# Patient Record
Sex: Female | Born: 1940 | Hispanic: Yes | Marital: Single
Health system: Southern US, Community
[De-identification: ages and names within clinical notes are randomized; demographics above are authoritative.]

## PROBLEM LIST (undated history)

## (undated) DIAGNOSIS — R350 Frequency of micturition: Secondary | ICD-10-CM

## (undated) DIAGNOSIS — G4700 Insomnia, unspecified: Secondary | ICD-10-CM

## (undated) DIAGNOSIS — K219 Gastro-esophageal reflux disease without esophagitis: Secondary | ICD-10-CM

## (undated) DIAGNOSIS — I209 Angina pectoris, unspecified: Secondary | ICD-10-CM

## (undated) DIAGNOSIS — E119 Type 2 diabetes mellitus without complications: Secondary | ICD-10-CM

## (undated) DIAGNOSIS — M25561 Pain in right knee: Secondary | ICD-10-CM

## (undated) HISTORY — PX: OTHER SURGICAL HISTORY: SHX169

## (undated) HISTORY — DX: Pain in right knee: M25.561

## (undated) HISTORY — DX: Type 2 diabetes mellitus without complications: E11.9

## (undated) HISTORY — DX: Gastro-esophageal reflux disease without esophagitis: K21.9

---

## 2010-04-22 ENCOUNTER — Ambulatory Visit: Payer: Self-pay | Admitting: Family Medicine

## 2010-08-07 NOTE — Assessment & Plan Note (Signed)
Summary: FLU SHOT/EVM  Nurse Visit   Immunizations Administered:  Influenza Vaccine # 1:    Vaccine Type: Fluvax Non-MCR    Site: right deltoid    Mfr: GlaxoSmithKline    Dose: 0.5 ml    Route: IM    Given by: Jaime Boothe LPN    Exp. Date: 5/12    Lot #: [id number]    VIS given: 01/30/10 version given April 22, 2010.  Orders Added: 1)  Influenza Vaccine NON MCR [00028] 

## 2012-06-17 ENCOUNTER — Ambulatory Visit: Payer: Self-pay | Admitting: Internal Medicine

## 2012-06-29 ENCOUNTER — Ambulatory Visit: Payer: Self-pay

## 2012-08-05 ENCOUNTER — Ambulatory Visit: Payer: Self-pay | Admitting: General Surgery

## 2013-02-17 ENCOUNTER — Ambulatory Visit: Payer: Self-pay | Admitting: Internal Medicine

## 2013-04-22 ENCOUNTER — Ambulatory Visit: Payer: Self-pay | Admitting: Internal Medicine

## 2013-05-13 ENCOUNTER — Ambulatory Visit: Payer: Self-pay | Admitting: Internal Medicine

## 2013-05-19 ENCOUNTER — Inpatient Hospital Stay: Payer: Self-pay | Admitting: Internal Medicine

## 2013-05-19 LAB — URINALYSIS, COMPLETE
Bilirubin,UR: NEGATIVE
Blood: NEGATIVE
Glucose,UR: NEGATIVE mg/dL (ref 0–75)
Ketone: NEGATIVE
Leukocyte Esterase: NEGATIVE
Ph: 7 (ref 4.5–8.0)
Protein: NEGATIVE
RBC,UR: 1 /HPF (ref 0–5)
WBC UR: <1 /HPF (ref 0–5)

## 2013-05-19 LAB — CBC
HGB: 10.8 g/dL — ABNORMAL LOW (ref 12.0–16.0)
MCH: 22.5 pg — ABNORMAL LOW (ref 26.0–34.0)
MCHC: 31.6 g/dL — ABNORMAL LOW (ref 32.0–36.0)

## 2013-05-19 LAB — COMPREHENSIVE METABOLIC PANEL
Albumin: 3.8 g/dL (ref 3.4–5.0)
Anion Gap: 1 — ABNORMAL LOW (ref 7–16)
BUN: 20 mg/dL — ABNORMAL HIGH (ref 7–18)
Calcium, Total: 9.3 mg/dL (ref 8.5–10.1)
Creatinine: 0.87 mg/dL (ref 0.60–1.30)
Glucose: 193 mg/dL — ABNORMAL HIGH (ref 65–99)
Osmolality: 284 (ref 275–301)
SGOT(AST): 27 U/L (ref 15–37)
Total Protein: 7.7 g/dL (ref 6.4–8.2)

## 2013-05-19 LAB — TROPONIN I: Troponin-I: 0.02 ng/mL

## 2013-05-19 LAB — IRON AND TIBC
Iron Bind.Cap.(Total): 332 ug/dL (ref 250–450)
Iron: 64 ug/dL (ref 50–170)
Unbound Iron-Bind.Cap.: 268 ug/dL

## 2013-05-19 LAB — TSH: Thyroid Stimulating Horm: 2.88 u[IU]/mL

## 2013-05-20 LAB — LIPID PANEL
HDL Cholesterol: 45 mg/dL (ref 40–60)
Ldl Cholesterol, Calc: 46 mg/dL (ref 0–100)
Triglycerides: 140 mg/dL (ref 0–200)
VLDL Cholesterol, Calc: 28 mg/dL (ref 5–40)

## 2013-05-20 LAB — CBC WITH DIFFERENTIAL/PLATELET
Eosinophil %: 6 %
Lymphocyte #: 3.2 10*3/uL (ref 1.0–3.6)
Monocyte %: 8.6 %
Neutrophil %: 39.5 %
Platelet: 181 10*3/uL (ref 150–440)
RBC: 4.61 10*6/uL (ref 3.80–5.20)
RDW: 14.8 % — ABNORMAL HIGH (ref 11.5–14.5)
WBC: 7 10*3/uL (ref 3.6–11.0)

## 2013-05-20 LAB — COMPREHENSIVE METABOLIC PANEL
Alkaline Phosphatase: 80 U/L (ref 50–136)
Bilirubin,Total: 0.5 mg/dL (ref 0.2–1.0)
Co2: 26 mmol/L (ref 21–32)
Creatinine: 0.7 mg/dL (ref 0.60–1.30)
EGFR (Non-African Amer.): >60
Glucose: 160 mg/dL — ABNORMAL HIGH (ref 65–99)
Osmolality: 284 (ref 275–301)
Total Protein: 7.1 g/dL (ref 6.4–8.2)

## 2013-05-23 ENCOUNTER — Ambulatory Visit: Payer: Self-pay | Admitting: Neurology

## 2013-05-24 ENCOUNTER — Encounter (HOSPITAL_COMMUNITY): Payer: Self-pay | Admitting: Physical Medicine and Rehabilitation

## 2013-05-24 ENCOUNTER — Encounter: Payer: Self-pay | Admitting: *Deleted

## 2013-05-24 ENCOUNTER — Inpatient Hospital Stay (HOSPITAL_COMMUNITY)
Admission: RE | Admit: 2013-05-24 | Discharge: 2013-06-17 | DRG: 945 | Disposition: A | Discharge status: Home Health | Payer: Medicaid Other | Source: Other Acute Inpatient Hospital | Attending: Physical Medicine & Rehabilitation | Admitting: Physical Medicine & Rehabilitation

## 2013-05-24 ENCOUNTER — Other Ambulatory Visit: Payer: Self-pay | Admitting: Physical Medicine and Rehabilitation

## 2013-05-24 ENCOUNTER — Encounter: Payer: Self-pay | Admitting: Physical Medicine and Rehabilitation

## 2013-05-24 DIAGNOSIS — I639 Cerebral infarction, unspecified: Secondary | ICD-10-CM | POA: Diagnosis present

## 2013-05-24 DIAGNOSIS — B964 Proteus (mirabilis) (morganii) as the cause of diseases classified elsewhere: Secondary | ICD-10-CM

## 2013-05-24 DIAGNOSIS — E1165 Type 2 diabetes mellitus with hyperglycemia: Secondary | ICD-10-CM

## 2013-05-24 DIAGNOSIS — E1142 Type 2 diabetes mellitus with diabetic polyneuropathy: Secondary | ICD-10-CM

## 2013-05-24 DIAGNOSIS — I10 Essential (primary) hypertension: Secondary | ICD-10-CM

## 2013-05-24 DIAGNOSIS — I209 Angina pectoris, unspecified: Secondary | ICD-10-CM

## 2013-05-24 DIAGNOSIS — E785 Hyperlipidemia, unspecified: Secondary | ICD-10-CM | POA: Diagnosis present

## 2013-05-24 DIAGNOSIS — I6789 Other cerebrovascular disease: Secondary | ICD-10-CM

## 2013-05-24 DIAGNOSIS — Z5189 Encounter for other specified aftercare: Principal | ICD-10-CM

## 2013-05-24 DIAGNOSIS — E1149 Type 2 diabetes mellitus with other diabetic neurological complication: Secondary | ICD-10-CM

## 2013-05-24 DIAGNOSIS — R339 Retention of urine, unspecified: Secondary | ICD-10-CM

## 2013-05-24 DIAGNOSIS — M25519 Pain in unspecified shoulder: Secondary | ICD-10-CM | POA: Diagnosis not present

## 2013-05-24 DIAGNOSIS — R4701 Aphasia: Secondary | ICD-10-CM

## 2013-05-24 DIAGNOSIS — D509 Iron deficiency anemia, unspecified: Secondary | ICD-10-CM | POA: Diagnosis present

## 2013-05-24 DIAGNOSIS — I6529 Occlusion and stenosis of unspecified carotid artery: Secondary | ICD-10-CM

## 2013-05-24 DIAGNOSIS — N179 Acute kidney failure, unspecified: Secondary | ICD-10-CM

## 2013-05-24 DIAGNOSIS — G8190 Hemiplegia, unspecified affecting unspecified side: Secondary | ICD-10-CM

## 2013-05-24 DIAGNOSIS — N390 Urinary tract infection, site not specified: Secondary | ICD-10-CM | POA: Diagnosis not present

## 2013-05-24 DIAGNOSIS — I6330 Cerebral infarction due to thrombosis of unspecified cerebral artery: Secondary | ICD-10-CM

## 2013-05-24 DIAGNOSIS — N319 Neuromuscular dysfunction of bladder, unspecified: Secondary | ICD-10-CM

## 2013-05-24 DIAGNOSIS — A498 Other bacterial infections of unspecified site: Secondary | ICD-10-CM

## 2013-05-24 DIAGNOSIS — N139 Obstructive and reflux uropathy, unspecified: Secondary | ICD-10-CM

## 2013-05-24 DIAGNOSIS — K219 Gastro-esophageal reflux disease without esophagitis: Secondary | ICD-10-CM

## 2013-05-24 HISTORY — DX: Insomnia, unspecified: G47.00

## 2013-05-24 HISTORY — DX: Angina pectoris, unspecified: I20.9

## 2013-05-24 HISTORY — DX: Frequency of micturition: R35.0

## 2013-05-24 LAB — GLUCOSE, CAPILLARY: Glucose-Capillary: 287 mg/dL — ABNORMAL HIGH (ref 70–99)

## 2013-05-24 MED ORDER — INSULIN ASPART 100 UNIT/ML ~~LOC~~ SOLN
0.0000 [IU] | Freq: Every day | SUBCUTANEOUS | Status: DC
  Administered 2013-05-24: 3 [IU] via SUBCUTANEOUS
  Administered 2013-05-25: 2 [IU] via SUBCUTANEOUS
  Administered 2013-05-26: 4 [IU] via SUBCUTANEOUS
  Administered 2013-05-27: 2 [IU] via SUBCUTANEOUS
  Administered 2013-05-29 – 2013-06-01 (×4): 3 [IU] via SUBCUTANEOUS
  Administered 2013-06-02 – 2013-06-05 (×3): 2 [IU] via SUBCUTANEOUS

## 2013-05-24 MED ORDER — ATORVASTATIN CALCIUM 20 MG PO TABS
20.0000 mg | ORAL_TABLET | Freq: Every day | ORAL | Status: DC
  Administered 2013-05-24 – 2013-06-17 (×25): 20 mg via ORAL
  Filled 2013-05-24 (×25): qty 1

## 2013-05-24 MED ORDER — INSULIN ASPART 100 UNIT/ML ~~LOC~~ SOLN
0.0000 [IU] | Freq: Three times a day (TID) | SUBCUTANEOUS | Status: DC
  Administered 2013-05-24 – 2013-05-25 (×3): 2 [IU] via SUBCUTANEOUS
  Administered 2013-05-25 – 2013-05-26 (×2): 5 [IU] via SUBCUTANEOUS
  Administered 2013-05-26: 2 [IU] via SUBCUTANEOUS
  Administered 2013-05-26: 5 [IU] via SUBCUTANEOUS
  Administered 2013-05-27: 2 [IU] via SUBCUTANEOUS
  Administered 2013-05-27: 3 [IU] via SUBCUTANEOUS
  Administered 2013-05-27: 5 [IU] via SUBCUTANEOUS
  Administered 2013-05-28: 3 [IU] via SUBCUTANEOUS
  Administered 2013-05-28 (×2): 5 [IU] via SUBCUTANEOUS
  Administered 2013-05-29: 3 [IU] via SUBCUTANEOUS

## 2013-05-24 MED ORDER — TRAZODONE HCL 50 MG PO TABS
25.0000 mg | ORAL_TABLET | Freq: Every evening | ORAL | Status: DC | PRN
  Administered 2013-05-30: 50 mg via ORAL
  Filled 2013-05-24: qty 1

## 2013-05-24 MED ORDER — ALUM & MAG HYDROXIDE-SIMETH 200-200-20 MG/5ML PO SUSP
30.0000 mL | ORAL | Status: DC | PRN
  Administered 2013-06-03: 30 mL via ORAL
  Filled 2013-05-24: qty 30

## 2013-05-24 MED ORDER — METFORMIN HCL 500 MG PO TABS
500.0000 mg | ORAL_TABLET | Freq: Two times a day (BID) | ORAL | Status: DC
  Administered 2013-05-24 – 2013-05-25 (×2): 500 mg via ORAL
  Filled 2013-05-24 (×4): qty 1

## 2013-05-24 MED ORDER — DIPHENHYDRAMINE HCL 12.5 MG/5ML PO ELIX: 12.5000 mg | ORAL_SOLUTION | Freq: Four times a day (QID) | ORAL | Status: DC | PRN

## 2013-05-24 MED ORDER — FLEET ENEMA 7-19 GM/118ML RE ENEM: 1.0000 | ENEMA | Freq: Once | RECTAL | Status: AC | PRN

## 2013-05-24 MED ORDER — PANTOPRAZOLE SODIUM 20 MG PO TBEC
20.0000 mg | DELAYED_RELEASE_TABLET | Freq: Every day | ORAL | Status: DC
  Administered 2013-05-25 – 2013-06-17 (×24): 20 mg via ORAL
  Filled 2013-05-24 (×26): qty 1

## 2013-05-24 MED ORDER — PROCHLORPERAZINE MALEATE 5 MG PO TABS
5.0000 mg | ORAL_TABLET | Freq: Four times a day (QID) | ORAL | Status: DC | PRN
  Filled 2013-05-24: qty 2

## 2013-05-24 MED ORDER — TRAMADOL HCL 50 MG PO TABS
50.0000 mg | ORAL_TABLET | Freq: Four times a day (QID) | ORAL | Status: DC | PRN
  Administered 2013-05-24 – 2013-06-17 (×17): 50 mg via ORAL
  Filled 2013-05-24 (×17): qty 1

## 2013-05-24 MED ORDER — ACETAMINOPHEN 325 MG PO TABS
325.0000 mg | ORAL_TABLET | ORAL | Status: DC | PRN
  Administered 2013-05-24 – 2013-06-14 (×10): 650 mg via ORAL
  Filled 2013-05-24 (×11): qty 2

## 2013-05-24 MED ORDER — POLYSACCHARIDE IRON COMPLEX 150 MG PO CAPS
150.0000 mg | ORAL_CAPSULE | Freq: Every day | ORAL | Status: DC
  Administered 2013-05-25 – 2013-06-17 (×24): 150 mg via ORAL
  Filled 2013-05-24 (×25): qty 1

## 2013-05-24 MED ORDER — ENOXAPARIN SODIUM 40 MG/0.4ML ~~LOC~~ SOLN
40.0000 mg | SUBCUTANEOUS | Status: DC
  Administered 2013-05-24 – 2013-06-16 (×24): 40 mg via SUBCUTANEOUS
  Filled 2013-05-24 (×25): qty 0.4

## 2013-05-24 MED ORDER — BISACODYL 10 MG RE SUPP: 10.0000 mg | Freq: Every day | RECTAL | Status: DC | PRN

## 2013-05-24 MED ORDER — GUAIFENESIN-DM 100-10 MG/5ML PO SYRP: 5.0000 mL | ORAL_SOLUTION | Freq: Four times a day (QID) | ORAL | Status: DC | PRN

## 2013-05-24 MED ORDER — RANOLAZINE ER 500 MG PO TB12
500.0000 mg | ORAL_TABLET | Freq: Two times a day (BID) | ORAL | Status: DC
  Administered 2013-05-24 – 2013-06-17 (×48): 500 mg via ORAL
  Filled 2013-05-24 (×51): qty 1

## 2013-05-24 MED ORDER — PROCHLORPERAZINE 25 MG RE SUPP
12.5000 mg | Freq: Four times a day (QID) | RECTAL | Status: DC | PRN
  Filled 2013-05-24: qty 1

## 2013-05-24 MED ORDER — ASPIRIN EC 325 MG PO TBEC
325.0000 mg | DELAYED_RELEASE_TABLET | Freq: Every day | ORAL | Status: DC
  Administered 2013-05-25 – 2013-06-17 (×24): 325 mg via ORAL
  Filled 2013-05-24 (×26): qty 1

## 2013-05-24 MED ORDER — SENNOSIDES-DOCUSATE SODIUM 8.6-50 MG PO TABS: 1.0000 | ORAL_TABLET | Freq: Every evening | ORAL | Status: DC | PRN

## 2013-05-24 MED ORDER — PROCHLORPERAZINE EDISYLATE 5 MG/ML IJ SOLN
5.0000 mg | Freq: Four times a day (QID) | INTRAMUSCULAR | Status: DC | PRN
  Filled 2013-05-24: qty 2

## 2013-05-24 NOTE — PMR Pre-admission (Signed)
[employer] PMR Admission Coordinator Pre-Admission Assessment  Patient: Stacie [name] is an 72 y.o., female MRN: [id number] DOB: [birth year] Height: 5\' 3"  (160 cm) Weight: 83.462 kg (184 lb)  Insurance Information Self pay - Has a green card and social security number.  Can apply for medicaid in the next month since she has been in [place] for 5 years per grand daughter.  Medicaid Application Date: Pending       Case Manager:   Disability Application Date:  Pendng      Case Worker:    Emergency Contact Information Contact Information   Name Relation Home Work Mobile   Towson Grandaughter   [phone]   Louanna Raw   [phone]      Current Medical History  Patient Admitting Diagnosis:  L CVA  History of Present Illness: A 72 yr old Spanish speaking female presented to [facility] with right sided weakness.  Was found on the floor by family and unable to get up.  She was noted to have difficulty speaking and was not able to speak the way she usually did.  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  Patient has expressive aphasia. Patient has had lethergy and confusion. Patient is following commands.   Family reports patient more alert now.   PT and OT have been seeing patient and recommended inpatient rehab.  Family very supportive and wanted inpatient rehab admission.  Patient's medical record from [facility] has been reviewed by the rehabilitation admission coordinator and physician.  NIH Stroke scale: Glascow Coma Scale:  Past Medical History  Diabetes mellitus Hyperlipidemia CAD Right lower extremity/knee pain  Family History  Sister with hypertension.  Two sisters have diabetes.  Parents are both deceased.  Prior Rehab/Hospitalizations:  No previous rehab   Current Medications Asa, lipitor, colace, heparin injection, novolog insulin, prilosec, ranexa, glucophage  Patients Current Diet:  Mechanical soft  diet  Precautions / Restrictions Precautions Precautions: Fall   Prior Activity Level Limited Community (1-2x/wk): Went out 3 X a week to [employer] and Sunday, grocery store and to the [employer] / Equipment None used prior to admission   Prior Functional Level Current Functional Level  Bed Mobility  Independent  Max assist   Transfers  Independent  Max assist   Mobility - Programmer, applications  Total assist   Upper Body Dressing  Independent  Mod assist   Lower Body Dressing  Independent  Max assist   Grooming  Independent  Mod assist   Eating/Drinking  Independent  Mod assist   Toilet Transfer  Independent  Total assist   Bladder Continence   WDL  Incontinent   Bowel Management  WDL  Having BM regularly   Stair Climbing  Independent Other (Not tried)   Communication  Spanish speaking  Expressive aphasia   Memory  WDL  Impaired   Cooking/Meal Prep  I      Housework  I    Money Management  I    Driving        Previous Home Environment Living Arrangements: Other relatives (Lives with granddaughter.)  Lives With: Other (Comment) (Lives with granddaughter) Available Help at Discharge: Family;Available 24 hours/day Type of Home: House Home Layout: One level Home Access: Stairs to enter [employer] of Steps: 4  Discharge Living Setting Plans for Discharge Living Setting: House;Lives with (comment) (Lives with granddaughter) Type of Home at Discharge: House Discharge Home Layout: One level Discharge Home Access: Stairs  to enter Entrance Stairs-Number of Steps: 4 Does the patient have any problems obtaining your medications?: Yes (Describe) (No insurance.)  Social/Family/Support Systems Patient Roles: Parent Contact Information: Alfonso Patten - grand daughter [phone] Anticipated Caregiver: Granddaughters X 2 and other family members Ability/Limitations of Caregiver: Granddaughter  works, but family will share caregiver responsibilities Caregiver Availability: 24/7 Discharge Plan Discussed with Primary Caregiver: Yes Is Caregiver In Agreement with Plan?: Yes Does Caregiver/Family have Issues with Lodging/Transportation while Pt is in Rehab?: Yes (Sister from out of town may come stay in room with her.)  Goals/Additional Needs Patient/Family Goal for Rehab: PT/OT S/Min assist goals, ST S/Min Assist goals Expected length of stay: 2-3 weeks Cultural Considerations: Spanish, [place] Witness Dietary Needs: Mechanical soft diet Equipment Needs: TBD Additional Information: Has been in [place] for 5 years.  Did not drive.  Was independent.  Helped grandaughter with all chores at home. Pt/Family Agrees to Admission and willing to participate: Yes Program Orientation Provided & Reviewed with Pt/Caregiver Including Roles  & Responsibilities: Yes  Patient Condition: I met with the patient and family on 05/21/13 at the bedside.  Patient is a candidate for acute inpatient rehab admission due to new L CVA.  Currently needing PT/OT/ST therapies.  Can tolerate and benefit from comprehensive inpatient rehab program prior to home with family providing care and supervision.  Currently requiring mod/max assist for transfers.  Patient is medically stable and ready for inpatient rehab admission today, 05/24/13.  I have discussed my assessment with Dr. Riley Kill and have approval for acute inpatient rehab admission for today, 05/24/13  Preadmission Screen Completed By:  Ranelle Oyster, 05/24/2013 11:13 AM ______________________________________________________________________   Discussed status with Dr. Riley Kill on 05/24/13 at 1058 and received telephone approval for admission today.  Admission Coordinator:  Ranelle Oyster, time 1104/Date 05/24/13   Assessment/Plan: Diagnosis: left ACA infarct 1. Does the need for close, 24 hr/day  Medical supervision in concert with the patient's rehab needs make  it unreasonable for this patient to be served in a less intensive setting? Yes 2. Co-Morbidities requiring supervision/potential complications: dm, CAD 3. Due to bladder management, bowel management, safety, skin/wound care, disease management, medication administration, pain management and patient education, does the patient require 24 hr/day rehab nursing? Yes 4. Does the patient require coordinated care of a physician, rehab nurse, PT (1-2 hrs/day, 5 days/week), OT (1-2 hrs/day, 5 days/week) and SLP (1-2 hrs/day, 5 days/week) to address physical and functional deficits in the context of the above medical diagnosis(es)? Yes Addressing deficits in the following areas: balance, endurance, locomotion, strength, transferring, bowel/bladder control, bathing, dressing, feeding, grooming, toileting, cognition, speech, swallowing and psychosocial support 5. Can the patient actively participate in an intensive therapy program of at least 3 hrs of therapy 5 days a week? Yes 6. The potential for patient to make measurable gains while on inpatient rehab is excellent 7. Anticipated functional outcomes upon discharge from inpatients are supervision to min assist PT, supervision to min assist OT, supervision to min assist SLP 8. Estimated rehab length of stay to reach the above functional goals is: 14-20 days 9. Does the patient have adequate social supports to accommodate these discharge functional goals? Yes 10. Anticipated D/C setting: Home 11. Anticipated post D/C treatments: HH therapy 12. Overall Rehab/Functional Prognosis: good    RECOMMENDATIONS: This patient's condition is appropriate for continued rehabilitative care in the following setting: CIR Patient has agreed to participate in recommended program. Yes Note that insurance prior authorization may be required for reimbursement for recommended  care.  Comment: Pt to be admitted to inpatient rehab today.   Faith Rogue T 05/24/2013

## 2013-05-24 NOTE — Progress Notes (Signed)
Nutrition Brief Note  RD consulted by PA for "food choices." RD discussed nutrition hx with granddaughter and granddaughter-in-law at bedside. Pt is currently sleeping. Per family, pt ate well PTA with stable weight. While at [facility], pt was eating almost 100% of all meals. Family would like to bring in some of patient's favorite foods for her. We discussed current diet restriction of Dysphagia 3 and discussed need for compliance with this diet until SLP says otherwise. Family verbalized understanding. Encouraged family to bring in foods that meet patient's diet restrictions. Family denies any further questions/concerns at this time.  Body mass index is 33.59 kg/(m^2). Patient meets criteria for Obese Class I based on current BMI.   Current diet order is Dysphagia 3. Labs and medications reviewed.   No nutrition interventions warranted at this time. If nutrition issues arise, please consult RD.   Jarold Motto MS, RD, LDN Pager: [phone] After-hours pager: [phone]

## 2013-05-24 NOTE — Progress Notes (Signed)
Pt transferred to Rehab from [facility]. [place], but arousal during most of orientation. Family in room to assist with questions. Family orientated to Rehab schedule and expectations. Diagnostic specific handout provided. Spanish is pt's primary language.

## 2013-05-24 NOTE — H&P (Signed)
Physical Medicine and Rehabilitation Admission H&P     CC:  Difficulty speaking, right sided weakness.   HPI:  Ms. Stacie [name] is a 72 year old non-english speaking [place] female with history of DM, HTN, angina (negative cath this summer); who was found on the floor by family with inability to speak or move her right side. She was taken to [facility] on 05/19/13 and MRI brain done revealing L-ACA infarct affecting medial frontal lobe and mild generalized small vessel disease. Carotid dopplers with <50% R-ICA stenosis.  2D echo with EF 55-60% with moderately elevated pulmonary artery pressures. Mild TVR. Neurology (Dr. 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   Patient continues to have dense right hemiparesis, is able to follow basic commands  25-50% accuracy (with interpreter) but mute. On mechanical soft diet. Therapies ongoing and working on pregait activities.  Therapy team recommended CIR and patient admitted today for intensive therapies.   Review of Systems  Eyes: Negative for blurred vision and double vision.  Respiratory: Positive for shortness of breath. Negative for cough and wheezing.   Cardiovascular: Positive for chest pain (better with ranexa).  Gastrointestinal: Positive for heartburn. Negative for nausea and vomiting.  Genitourinary: Positive for urgency and frequency.  Musculoskeletal: Positive for back pain and myalgias.  Neurological: Positive for sensory change (burning in RLE since stroke), speech change, focal weakness and headaches (new since stroke).  Psychiatric/Behavioral: The patient has insomnia (chronic problem).       Past Medical History   Diagnosis  Date   .  Diabetes mellitus without complication     .  GERD (gastroesophageal reflux disease)     .  Right knee pain      Past Surgical History   Procedure  Laterality  Date   .  Tendon  Right         heel tendon surgery    Family History   Problem  Relation  Age  of Onset   .  Diabetes  Sister     .  Hypertension  Sister      Social History:  Widowed. Lives with granddaughter and independent PTA. Goes back and forth to [place] during the year.  Granddaughter works but large family around who can help past discharge.  Does not use  tobacco, alcohol, or illicit drugs.  Allergies: No Known Allergies  (Not in a hospital admission)  Home:     Functional History: Independent PTA.    Functional Status:   Mobility: Max assist for bed mobility. Moderate to max assist for sit to stand transfer.   Able to sit at EOB with supervision and self correct for LOB. Able to weight shift with CGA but difficulty weight bearing .     ADL: Needs assistance for ADLs  Cognition: Unable to speak--expressive deficits. Able to follow basic commands  25-50% with visual cues.     Filed Vitals:   05/24/13 1345  BP: 121/73  Pulse: 77  Temp: 98.7 F (37.1 C)  Resp: 24   Physical Exam  Nursing note and vitals reviewed. Constitutional: She is oriented to person, place, and time. She appears well-developed and well-nourished.  HENT:  Head: Normocephalic.  Right Ear: External ear normal.  Left Ear: External ear normal.  Mouth/Throat: Oropharynx is clear and moist. No oropharyngeal exudate.  Eyes: Conjunctivae and EOM are normal. Pupils are equal, round, and reactive to light. Right eye exhibits no discharge. Left eye exhibits no discharge.  No scleral icterus.  Neck: No JVD present. No tracheal deviation present. No thyromegaly present.  Cardiovascular: Normal rate and regular rhythm.  Exam reveals no friction rub.   No murmur heard. Pulmonary/Chest: Effort normal and breath sounds normal. No respiratory distress. She has no wheezes. She has no rales.  Abdominal: There is no tenderness. There is no rebound.  Musculoskeletal: She exhibits no edema and no tenderness.  Lymphadenopathy:    She has no cervical adenopathy.  Neurological: She is alert and  oriented to person, place, and time.  Very alert. Speaks no english. Has mild right facial weakness. Followed all commands and answered questions in spanish without substantial difficulties. Appears to have primarily expressive language deficits. RUE is 0/5 prox to distal. RLE is also 0/5 prox to distal. She senses pinch in both the right arm and leg. No resting tone and dtr's are 1+    Recent Labs:  Na: 140     K: 3.7    Cl:106   Co2: 26   BUN: 17   Cr: 0.7      Chol: 119      LDL: 46    HDL: 45   Trig: 140          Iron: 19   TIBC: 332    UIBC: 268 Hgb: 10.3    Hct: 32.6    WBC:  7.0    Plt: 181 Hgb A1C:  7.4              TSH: 2.88   Post Admission Physician Evaluation: 1. Functional deficits secondary  to thrombotic left ACA infarct. 2. Patient is admitted to receive collaborative, interdisciplinary care between the physiatrist, rehab nursing staff, and therapy team. 3. Patient's level of medical complexity and substantial therapy needs in context of that medical necessity cannot be provided at a lesser intensity of care such as a SNF. 4. Patient has experienced substantial functional loss from his/her baseline which was documented above under the "Functional History" and "Functional Status" headings.  Judging by the patient's diagnosis, physical exam, and functional history, the patient has potential for functional progress which will result in measurable gains while on inpatient rehab.  These gains will be of substantial and practical use upon discharge  in facilitating mobility and self-care at the household level. 5. Physiatrist will provide 24 hour management of medical needs as well as oversight of the therapy plan/treatment and provide guidance as appropriate regarding the interaction of the two. 6. 24 hour rehab nursing will assist with bladder management, bowel management, safety, skin/wound care, disease management, medication administration, pain management and patient education  and  help integrate therapy concepts, techniques,education, etc. 7. PT will assess and treat for/with: Lower extremity strength, range of motion, stamina, balance, functional mobility, safety, adaptive techniques and equipment, NMR, cognitive linguistic integration.   Goals are: supervision to min assist. 8. OT will assess and treat for/with: ADL's, functional mobility, safety, upper extremity strength, adaptive techniques and equipment, NMR, cognitive linguistic integration.   Goals are: supervision to min assist. 9. SLP will assess and treat for/with: language, communication.  Goals are: supervision to min assist. 10. Case Management and Social Worker will assess and treat for psychological issues and discharge planning. 11. Team conference will be held weekly to assess progress toward goals and to determine barriers to discharge. 12. Patient will receive at least 3 hours of therapy per day at least 5 days per week. 13. ELOS: 20-25 days  14. 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 DVT Prophylaxis/Anticoagulation: Pharmaceutical: Lovenox 2. Pain Management:  Will continue prn tramadol for chronic RLE/right knee pain.   3. Mood: difficulty to ascertain with language as well as language barrier. Family to assist with input. LCSW to follow for evaluation. Appeared to be fairly up beat on examination today. .   4. Neuropsych: This patient is not capable of making decisions on her own behalf. 5. HTN:  Will monitor with bid checks. Continue renaxa. Want to avoid hypotension for now.   6. DM type 2: Continue metformin bid. Monitor BS with ac/hs checks and use SSI for elevated BS. Titrate as indicated for BS control.   7. H/o angina:  Will continue Renexa.  8. Iron deficiency anemia: will add iron supplement. Check Vit B12 levels.    Ranelle Oyster, MD, [facility] Physical Medicine & Rehabilitation

## 2013-05-25 ENCOUNTER — Inpatient Hospital Stay (HOSPITAL_COMMUNITY): Payer: Self-pay | Admitting: Occupational Therapy

## 2013-05-25 ENCOUNTER — Inpatient Hospital Stay (HOSPITAL_COMMUNITY): Payer: Self-pay | Admitting: Rehabilitation

## 2013-05-25 ENCOUNTER — Inpatient Hospital Stay (HOSPITAL_COMMUNITY): Payer: Medicaid Other | Admitting: Speech Pathology

## 2013-05-25 DIAGNOSIS — I6330 Cerebral infarction due to thrombosis of unspecified cerebral artery: Secondary | ICD-10-CM

## 2013-05-25 DIAGNOSIS — E1165 Type 2 diabetes mellitus with hyperglycemia: Secondary | ICD-10-CM

## 2013-05-25 LAB — COMPREHENSIVE METABOLIC PANEL
ALT: 10 U/L (ref 0–35)
AST: 15 U/L (ref 0–37)
Albumin: 3 g/dL — ABNORMAL LOW (ref 3.5–5.2)
Alkaline Phosphatase: 64 U/L (ref 39–117)
Chloride: 100 mEq/L (ref 96–112)
Creatinine, Ser: 1.54 mg/dL — ABNORMAL HIGH (ref 0.50–1.10)
Potassium: 4.3 mEq/L (ref 3.5–5.1)
Sodium: 138 mEq/L (ref 135–145)
Total Bilirubin: 0.4 mg/dL (ref 0.3–1.2)

## 2013-05-25 LAB — CBC WITH DIFFERENTIAL/PLATELET
Basophils Absolute: 0 10*3/uL (ref 0.0–0.1)
Basophils Relative: 0 % (ref 0–1)
Eosinophils Absolute: 0.5 10*3/uL (ref 0.0–0.7)
Eosinophils Relative: 4 % (ref 0–5)
HCT: 31.9 % — ABNORMAL LOW (ref 36.0–46.0)
Hemoglobin: 10.4 g/dL — ABNORMAL LOW (ref 12.0–15.0)
Lymphocytes Relative: 27 % (ref 12–46)
Lymphs Abs: 3.2 10*3/uL (ref 0.7–4.0)
MCH: 22.8 pg — ABNORMAL LOW (ref 26.0–34.0)
MCHC: 32.6 g/dL (ref 30.0–36.0)
MCV: 70 fL — ABNORMAL LOW (ref 78.0–100.0)
Monocytes Absolute: 1.5 10*3/uL — ABNORMAL HIGH (ref 0.1–1.0)
Monocytes Relative: 13 % — ABNORMAL HIGH (ref 3–12)
Neutro Abs: 6.6 10*3/uL (ref 1.7–7.7)
Neutrophils Relative %: 56 % (ref 43–77)
Platelets: 203 10*3/uL (ref 150–400)
RBC: 4.56 MIL/uL (ref 3.87–5.11)
RDW: 14.7 % (ref 11.5–15.5)
WBC: 11.8 10*3/uL — ABNORMAL HIGH (ref 4.0–10.5)

## 2013-05-25 LAB — URINALYSIS, ROUTINE W REFLEX MICROSCOPIC
Bilirubin Urine: NEGATIVE
Glucose, UA: NEGATIVE mg/dL
Hgb urine dipstick: NEGATIVE
Ketones, ur: NEGATIVE mg/dL
Leukocytes, UA: NEGATIVE
Nitrite: NEGATIVE
Protein, ur: NEGATIVE mg/dL
Specific Gravity, Urine: 1.012 (ref 1.005–1.030)
Urobilinogen, UA: 0.2 mg/dL (ref 0.0–1.0)
pH: 5 (ref 5.0–8.0)

## 2013-05-25 LAB — GLUCOSE, CAPILLARY
Glucose-Capillary: 189 mg/dL — ABNORMAL HIGH (ref 70–99)
Glucose-Capillary: 297 mg/dL — ABNORMAL HIGH (ref 70–99)
Glucose-Capillary: 183 mg/dL — ABNORMAL HIGH (ref 70–99)
Glucose-Capillary: 201 mg/dL — ABNORMAL HIGH (ref 70–99)

## 2013-05-25 MED ORDER — CEPHALEXIN 250 MG PO CAPS
250.0000 mg | ORAL_CAPSULE | Freq: Three times a day (TID) | ORAL | Status: DC
  Administered 2013-05-25 – 2013-05-26 (×3): 250 mg via ORAL
  Filled 2013-05-25 (×6): qty 1

## 2013-05-25 MED ORDER — GLIMEPIRIDE 2 MG PO TABS
2.0000 mg | ORAL_TABLET | Freq: Every day | ORAL | Status: DC
  Filled 2013-05-25 (×2): qty 1

## 2013-05-25 MED ORDER — SODIUM CHLORIDE 0.9 % IV SOLN
INTRAVENOUS | Status: AC
  Administered 2013-05-25: 1000 mL via INTRAVENOUS

## 2013-05-25 MED ORDER — GLIMEPIRIDE 2 MG PO TABS: 2.0000 mg | ORAL_TABLET | Freq: Every day | ORAL | Status: DC

## 2013-05-25 MED ORDER — LIDOCAINE HCL 2 % EX GEL
CUTANEOUS | Status: DC | PRN
  Filled 2013-05-25: qty 5

## 2013-05-25 MED ORDER — INSULIN ASPART 100 UNIT/ML ~~LOC~~ SOLN
3.0000 [IU] | Freq: Three times a day (TID) | SUBCUTANEOUS | Status: DC
  Administered 2013-05-25 – 2013-05-27 (×6): 3 [IU] via SUBCUTANEOUS

## 2013-05-25 NOTE — Evaluation (Signed)
Occupational Therapy Assessment and Plan  Patient Details  Name: Stacie [name] MRN: [id number] Date of Birth: [birth year]  OT Diagnosis: acute pain, cognitive deficits, disturbance of vision, flaccid hemiplegia and hemiparesis, hemiplegia affecting dominant side and muscle weakness (generalized) Rehab Potential: Rehab Potential: Good ELOS: 25-28 days   Today's Date: 05/25/2013 Time: 1102-1202 Time Calculation (min): 60 min  Problem List:  Patient Active Problem List   Diagnosis Date Noted  . CVA (cerebral infarction) 05/24/2013  . Type II or unspecified type diabetes mellitus with neurological manifestations, not stated as uncontrolled(250.60) 05/24/2013  . Dyslipidemia 05/24/2013  . GERD (gastroesophageal reflux disease) 05/24/2013  . Iron deficiency anemia, unspecified 05/24/2013    Past Medical History:  Past Medical History  Diagnosis Date  . Diabetes mellitus without complication   . GERD (gastroesophageal reflux disease)   . Right knee pain   . Angina pectoris   . Insomnia   . Frequency of urination    Past Surgical History:  Past Surgical History  Procedure Laterality Date  . Tendon Right     heel tendon surgery    Assessment & Plan Clinical Impression: Patient is a 71 y.o. year old female with recent admission to the [facility] hospital on on 05/19/13 and MRI brain done revealing L-ACA infarct affecting medial frontal lobe and mild generalized small vessel disease.  Patient transferred to CIR on 05/24/2013 .    Patient currently requires total with basic self-care skills secondary to muscle weakness, impaired timing and sequencing, abnormal tone, unbalanced muscle activation and decreased coordination and decreased awareness, decreased problem solving and decreased memory.  Prior to hospitalization, patient could complete ADLs with independent .  Patient will benefit from skilled intervention to decrease level of assist with basic self-care skills  and increase independence with basic self-care skills prior to discharge home with care partner.  Anticipate patient will require minimal physical assistance and follow up home health.  OT - End of Session Activity Tolerance: Tolerates 30+ min activity with multiple rests Endurance Deficit: No OT Assessment Rehab Potential: Good OT Patient demonstrates impairments in the following area(s): Balance;Cognition;Endurance;Motor;Vision;Sensory;Safety;Pain OT Basic ADL's Functional Problem(s): Eating;Grooming;Bathing;Dressing;Toileting OT Transfers Functional Problem(s): Toilet;Tub/Shower OT Additional Impairment(s): Fuctional Use of Upper Extremity OT Plan OT Intensity: Minimum of 1-2 x/day, 45 to 90 minutes OT Frequency: 5 out of 7 days OT Duration/Estimated Length of Stay: 25-28 days OT Treatment/Interventions: Social worker;Therapeutic Activities;Therapeutic Exercise;UE/LE Strength taining/ROM;UE/LE Coordination activities;Community reintegration;Functional electrical stimulation;Neuromuscular re-education;Patient/family education;Self Care/advanced ADL retraining;Splinting/orthotics;Pain management OT Self Feeding Anticipated Outcome(s): modified independent OT Basic Self-Care Anticipated Outcome(s): min assist OT Toileting Anticipated Outcome(s): min assist OT Bathroom Transfers Anticipated Outcome(s): min assist OT Recommendation Patient destination: Home Follow Up Recommendations: Home health OT Equipment Recommended: 3 in 1 bedside comode;Tub/shower bench   OT Evaluation Precautions/Restrictions  Precautions Precautions: Fall Precaution Comments: dense R hemiplegia, pain in R wrist (maybe from fall), pain in R knee, expressive aphasia Restrictions Weight Bearing Restrictions: No  Pain Pain Assessment Pain Assessment: No/denies pain Pain Score: 0-No pain Home Living/Prior Functioning Home Living Available Help at Discharge:  Family;Available 24 hours/day Type of Home: House Home Access: Stairs to enter [employer] of Steps: 3 Entrance Stairs-Rails: Can reach both Home Layout: One level Additional Comments: has built in shower seat, vanity/sink beside of toilet to push from if needed  Lives With: Family Prior Function Level of Independence: Requires assistive device for independence  Able to Take Stairs?: Yes Driving: No Vocation: Retired Leisure: Hobbies-yes (Comment) Comments: Likes  to play with her grandchildren ADL  See FIM scale for details  Vision/Perception  Vision - History Baseline Vision: No visual deficits Patient Visual Report: No change from baseline Vision - Assessment Vision Assessment: Vision tested Ocular Range of Motion: Within Functional Limits Tracking/Visual Pursuits: Decreased smoothness of horizontal tracking Additional Comments: Pt with difficulty tracking into the right visual field.  Would scan to the right but unable to maintain in the right field and hold.  Lost fixation at times with tracking in both visual fields.  Perception Perception: Within Functional Limits Praxis Praxis: Intact  Cognition Overall Cognitive Status: Impaired/Different from baseline Arousal/Alertness: Awake/alert Orientation Level: Disoriented to situation Attention: Focused;Sustained Focused Attention: Appears intact Sustained Attention: Appears intact Sustained Attention Impairment: Functional basic Memory: Impaired Memory Impairment: Decreased recall of new information;Decreased short term memory Decreased Short Term Memory: Verbal basic Awareness: Appears intact (Intellecutal) Awareness Impairment: Emergent impairment Problem Solving: Impaired Problem Solving Impairment: Functional complex;Verbal complex;Verbal basic Safety/Judgment: Impaired Comments: Had interpreter ask pt what she would do if she needed to get up, pt proceeded to attempt to get out of chair, therefore donned  quick release belt for safety.  Sensation Sensation Light Touch: Impaired Detail Light Touch Impaired Details: Impaired RUE Stereognosis: Impaired Detail Stereognosis Impaired Details: Impaired RUE Hot/Cold: Not tested Proprioception: Impaired Detail Proprioception Impaired Details: Impaired RUE Additional Comments: Pt unable to determine light touch in the RUE consistently. Coordination Gross Motor Movements are Fluid and Coordinated: No Fine Motor Movements are Fluid and Coordinated: No Coordination and Movement Description: Pt with no active movement in the RUE or hand at this time. Heel Shin Test: Heel to shin is impaired, however feel it may partly be due to body habitus and increased difficulty getting LLE to RLE.   Motor  Motor Motor: Hemiplegia;Abnormal postural alignment and control Motor - Skilled Clinical Observations: Pt with dense R hemiplegia, LOB to the R with external perturbations Mobility  Bed Mobility Bed Mobility: Rolling Right;Right Sidelying to Sit Rolling Right: 2: Max assist Rolling Right Details: Verbal cues for sequencing;Verbal cues for technique;Manual facilitation for weight shifting Rolling Left: 2: Max assist Rolling Left Details: Verbal cues for sequencing;Verbal cues for technique;Tactile cues for initiation;Tactile cues for weight shifting;Tactile cues for placement;Manual facilitation for placement;Manual facilitation for weight bearing;Manual facilitation for weight shifting;Visual cues/gestures for sequencing Rolling Left Details (indicate cue type and reason): Pt requires total assist for management of RUE/LE when rolling and max verbal and manual facilitation for shifting LEs/trunk into sidelying position.  Interpreter present to assist with translation.  Right Sidelying to Sit: 1: +1 Total assist Right Sidelying to Sit Details: Manual facilitation for weight shifting;Manual facilitation for placement Left Sidelying to Sit: 2: Max assist;HOB  flat Left Sidelying to Sit Details: Tactile cues for initiation;Verbal cues for sequencing;Verbal cues for technique;Verbal cues for precautions/safety;Manual facilitation for placement;Manual facilitation for weight bearing;Manual facilitation for weight shifting Left Sidelying to Sit Details (indicate cue type and reason): Requires assist for BLEs off of mat table and also assist at hips/trunk to elevate trunk into sitting position.  Note that once movement initiated, pt able to use LUE to self assist in elevating trunk into sititng.   Sit to Supine: 3: Mod assist Sit to Supine - Details: Verbal cues for sequencing;Visual cues/gestures for sequencing;Tactile cues for initiation;Verbal cues for precautions/safety;Manual facilitation for weight shifting;Manual facilitation for placement Sit to Supine - Details (indicate cue type and reason): Pt requires assist for RLE into bed and also some assist to lower trunk  safely to mat table.  Provided more tactile and manual cues as well as demonstration cues for lying down.  Transfers Transfers: Sit to Stand Sit to Stand: 1: +2 Total assist;From bed;With upper extremity assist Sit to Stand Details: Verbal cues for sequencing;Manual facilitation for placement;Verbal cues for technique Sit to Stand Details (indicate cue type and reason): Requires assist on each side at waist to ensure safety and upright posture.  Also requires manual facilitation at R knee to prevent buckle.  Provided manual assist for increased forward weight shift and also facilitation for technique with scooting to edge of chair.   Stand to Sit: 1: +1 Total assist;With upper extremity assist Stand to Sit Details (indicate cue type and reason): Tactile cues for weight shifting;Verbal cues for precautions/safety;Manual facilitation for placement;Manual facilitation for weight shifting Stand to Sit Details: see above  Trunk/Postural Assessment  Cervical Assessment Cervical Assessment: Within  Functional Limits Thoracic Assessment Thoracic Assessment: Within Functional Limits Postural Control Postural Control: Deficits on evaluation Postural Limitations: Pt sitting in a posterior pelvic tilt with increased posterior lean.  Balance Balance Balance Assessed: Yes Static Sitting Balance Static Sitting - Balance Support: Bilateral upper extremity supported;Feet supported Static Sitting - Level of Assistance: 5: Stand by assistance Static Sitting - Comment/# of Minutes: Pt able to maintain stand by assist while sitting unsupported, however when given slight external perturbations towards the R, she would have LOB to the R and require min to mod assist to correct.  Dynamic Sitting Balance Dynamic Sitting - Balance Support: Left upper extremity supported Dynamic Sitting - Level of Assistance: 2: Max assist Extremity/Trunk Assessment RUE Assessment RUE Assessment: Exceptions to [facility] RUE Strength RUE Overall Strength Comments: Pt with Brunnstrum stage I movement in the right arm and hand.  PROM WFLS for all joints except the wrist where pt is reporting increased pain with extension and flexion. LUE Assessment LUE Assessment: Within Functional Limits  FIM:  FIM - Eating Eating Activity: 5: Supervision/cues;4: Help with picking up utensils FIM - Grooming Grooming Steps: Wash, rinse, dry face Grooming: 2: Patient completes 1 of 4 or 2 of 5 steps FIM - Bathing Bathing Steps Patient Completed: Chest;Right Arm;Abdomen;Left upper leg Bathing: 1: Two helpers FIM - Upper Body Dressing/Undressing Upper body dressing/undressing: 1: Total-Patient completed less than 25% of tasks FIM - Lower Body Dressing/Undressing Lower body dressing/undressing: 1: Two helpers FIM - Toileting Toileting: 1: Two helpers FIM - Banker Devices: Arm rests Bed/Chair Transfer: 2: Supine > Sit: Max A (lifting assist/Pt. 25-49%);1: Chair or W/C > Bed: Total A (helper does  all/Pt. < 25%) FIM - Toilet Transfers Toilet Transfers: 1-Two helpers   Refer to Care Plan for Long Term Goals  Recommendations for other services: None  Discharge Criteria: Patient will be discharged from OT if patient refuses treatment 3 consecutive times without medical reason, if treatment goals not met, if there is a change in medical status, if patient makes no progress towards goals or if patient is discharged from hospital.  The above assessment, treatment plan, treatment alternatives and goals were discussed and mutually agreed upon: by patient and by family  Pt began education and treatment with selfcare retraining sitting EOB.  Interpreter present during session.  She was able to follow 75% of one step commands related to selfcare tasks.  Needs total assist for integrations of the RUE, to sequence bathing and dressing, and for transfers and sit to stand.    Adreanne Yono OTR/L 05/25/2013, 1:16 PM

## 2013-05-25 NOTE — Progress Notes (Addendum)
Inpatient Diabetes Program Recommendations  AACE/ADA: New Consensus Statement on Inpatient Glycemic Control (2013)  Target Ranges:  Prepandial:   less than 140 mg/dL      Peak postprandial:   less than 180 mg/dL (1-2 hours)      Critically ill patients:  140 - 180 mg/dL   Hyperglycemia:  Inpatient Diabetes Program Recommendations Insulin - Basal: Not sure pt needs lantus at this point. If fasting is trending high, may want to add 10 units lantus. Insulin - Meal Coverage: Please add 3 units meal coverage (rather than Amaryl) per below. Oral Agents: Please do not use Amaryl while here and allow his beta cells to rest. (Use meal coverage instead)  Thank you, Lenor Coffin, RN, CNS, Diabetes Coordinator [phone])

## 2013-05-25 NOTE — Progress Notes (Signed)
Subjective/Complaints: Slept ok, family member translating Family member wondering if Ranexa caused stroke Small vessel infarct with hx DM (which causes SVD), doubt Ranexa since this can cause arrythmia and this wasn't embolic CVA. Review of Systems - Negative except r side weakness Objective: Vital Signs: Blood pressure 119/72, pulse 66, temperature 98.2 F (36.8 C), temperature source Oral, resp. rate 20, weight 86 kg (189 lb 9.5 oz), SpO2 97.00%. No results found. Results for orders placed during the hospital encounter of 05/24/13 (from the past 72 hour(s))  GLUCOSE, CAPILLARY     Status: Abnormal   Collection Time    05/24/13  9:35 PM      Result Value Range   Glucose-Capillary 287 (*) 70 - 99 mg/dL   Comment 1 Notify RN    URINALYSIS, ROUTINE W REFLEX MICROSCOPIC     Status: None   Collection Time    05/25/13  6:15 AM      Result Value Range   Color, Urine YELLOW  YELLOW   APPearance CLEAR  CLEAR   Specific Gravity, Urine 1.012  1.005 - 1.030   pH 5.0  5.0 - 8.0   Glucose, UA NEGATIVE  NEGATIVE mg/dL   Hgb urine dipstick NEGATIVE  NEGATIVE   Bilirubin Urine NEGATIVE  NEGATIVE   Ketones, ur NEGATIVE  NEGATIVE mg/dL   Protein, ur NEGATIVE  NEGATIVE mg/dL   Urobilinogen, UA 0.2  0.0 - 1.0 mg/dL   Nitrite NEGATIVE  NEGATIVE   Leukocytes, UA NEGATIVE  NEGATIVE   Comment: MICROSCOPIC NOT DONE ON URINES WITH NEGATIVE PROTEIN, BLOOD, LEUKOCYTES, NITRITE, OR GLUCOSE <1000 mg/dL.  CBC WITH DIFFERENTIAL     Status: Abnormal (Preliminary result)   Collection Time    05/25/13  6:58 AM      Result Value Range   WBC 11.8 (*) 4.0 - 10.5 K/uL   RBC 4.56  3.87 - 5.11 MIL/uL   Hemoglobin 10.4 (*) 12.0 - 15.0 g/dL   HCT 16.1 (*) 09.6 - 04.5 %   MCV 70.0 (*) 78.0 - 100.0 fL   MCH 22.8 (*) 26.0 - 34.0 pg   MCHC 32.6  30.0 - 36.0 g/dL   RDW 40.9  81.1 - 91.4 %   Platelets 203  150 - 400 K/uL   Neutrophils Relative % PENDING  43 - 77 %   Neutro Abs PENDING  1.7 - 7.7 K/uL   Band  Neutrophils PENDING  0 - 10 %   Lymphocytes Relative PENDING  12 - 46 %   Lymphs Abs PENDING  0.7 - 4.0 K/uL   Monocytes Relative PENDING  3 - 12 %   Monocytes Absolute PENDING  0.1 - 1.0 K/uL   Eosinophils Relative PENDING  0 - 5 %   Eosinophils Absolute PENDING  0.0 - 0.7 K/uL   Basophils Relative PENDING  0 - 1 %   Basophils Absolute PENDING  0.0 - 0.1 K/uL   WBC Morphology PENDING     RBC Morphology PENDING     Smear Review PENDING     nRBC PENDING  0 /100 WBC   Metamyelocytes Relative PENDING     Myelocytes PENDING     Promyelocytes Absolute PENDING     Blasts PENDING    GLUCOSE, CAPILLARY     Status: Abnormal   Collection Time    05/25/13  7:38 AM      Result Value Range   Glucose-Capillary 189 (*) 70 - 99 mg/dL   Comment 1 Notify RN  HEENT: normal Cardio: RRR and no murmur Resp: CTA B/L and unlabored GI: BS positive and non distended Extremity:  Pulses positive and No Edema Skin:   Intact Neuro: Alert/Oriented, Cranial Nerve Abnormalities R central 7,, Abnormal Sensory reduced sensory on Right side and Abnormal Motor 0/5 in RUE, trace hip ext otherwise 0/5 RLE Musc/Skel:  Other pain with R wrist ROM, no swelling or erythema Gen NAD   Assessment/Plan: 1. 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 Physiatrist is providing close team supervision and 24 hour management of active medical problems listed below. Physiatrist and rehab team continue to assess barriers to discharge/monitor patient progress toward functional and medical goals. FIM:                   Comprehension Comprehension Mode: Auditory Comprehension: 5-Understands basic 90% of the time/requires cueing < 10% of the time (Spanish pt's primary language. Family interperts)  Expression Expression Mode: Not assessed Expression:  2-Expresses basic 25 - 49% of the time/requires cueing 50 - 75% of the time. Uses single words/gestures.  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 DVT Prophylaxis/Anticoagulation: Pharmaceutical: Lovenox  2. Pain Management: Will continue prn tramadol for chronic RLE/right knee pain.  3. Mood: difficulty to ascertain with language as well as language barrier. Family to assist with input. LCSW to follow for evaluation. Appeared to be fairly up beat on examination today. .  4. Neuropsych: This patient is not capable of making decisions on her own behalf.  5. HTN: Will monitor with bid checks. Continue renaxa. Want to avoid hypotension for now.  6. DM type 2: Continue metformin bid. Monitor BS with ac/hs checks and use SSI for elevated BS. Titrate as indicated for BS control.  7. H/o angina: Will continue Renexa.  8. Iron deficiency anemia: will add iron supplement. Check Vit B12 levels.      LOS (Days) 1 A FACE TO FACE EVALUATION WAS PERFORMED  Evann Erazo E 05/25/2013, 7:59 AM

## 2013-05-25 NOTE — Evaluation (Signed)
Physical Therapy Assessment and Plan  Patient Details  Name: Stacie [name] MRN: [id number] Date of Birth: [birth year]  PT Diagnosis: Abnormal posture, Abnormality of gait, Cognitive deficits, Coordination disorder, Difficulty walking, Hemiplegia dominant, Impaired cognition, Impaired sensation, Muscle weakness and Pain in joint Rehab Potential: Good ELOS: 25-28 days   Today's Date: 05/25/2013 Time: 1000-1057 Time Calculation (min): 57 min  Problem List:  Patient Active Problem List   Diagnosis Date Noted  . CVA (cerebral infarction) 05/24/2013  . Type II or unspecified type diabetes mellitus with neurological manifestations, not stated as uncontrolled(250.60) 05/24/2013  . Dyslipidemia 05/24/2013  . GERD (gastroesophageal reflux disease) 05/24/2013  . Iron deficiency anemia, unspecified 05/24/2013    Past Medical History:  Past Medical History  Diagnosis Date  . Diabetes mellitus without complication   . GERD (gastroesophageal reflux disease)   . Right knee pain   . Angina pectoris   . Insomnia   . Frequency of urination    Past Surgical History:  Past Surgical History  Procedure Laterality Date  . Tendon Right     heel tendon surgery    Assessment & Plan Clinical Impression: Patient is a 72 y.o. year old non-english speaking [place] female with history of DM, HTN, angina (negative cath this summer); who was found on the floor by family with inability to speak or move her right side. She was taken to [facility] on 05/19/13 and MRI brain done revealing L-ACA infarct affecting medial frontal lobe and mild generalized small vessel disease. Carotid dopplers with <50% R-ICA stenosis. 2D echo with EF 55-60% with moderately elevated pulmonary artery pressures. Mild TVR. Neurology (Dr. 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 Patient continues to have dense right hemiparesis, is able to follow basic commands 25-50% accuracy (with  interpreter) but mute. On mechanical soft diet. Therapies ongoing and working on pregait activities. Therapy team recommended CIR and patient admitted today for intensive therapies.  Patient transferred to CIR on 05/24/2013.   Patient currently requires max with mobility (bed) and +2 assist for transfers, standing and gait secondary to muscle weakness, decreased cardiorespiratoy endurance, unbalanced muscle activation, decreased coordination and decreased motor planning, difficulty scanning to R and decreased awareness, decreased problem solving, decreased safety awareness, decreased memory and delayed processing.  Prior to hospitalization, patient was modified independent  with mobility and lived with Family in a House home.  Home access is 3Stairs to enter.  Patient will benefit from skilled PT intervention to maximize safe functional mobility, minimize fall risk and decrease caregiver burden for planned discharge home with 24 hour assist.  Anticipate patient will benefit from follow up [facility] at discharge.  PT - End of Session Activity Tolerance: Tolerates 30+ min activity with multiple rests Endurance Deficit: No PT Assessment Rehab Potential: Good PT Patient demonstrates impairments in the following area(s): Balance;Endurance;Motor;Pain;Safety PT Transfers Functional Problem(s): Bed Mobility;Bed to Chair;Car PT Locomotion Functional Problem(s): Ambulation;Wheelchair Mobility;Stairs PT Plan PT Intensity: Minimum of 1-2 x/day ,45 to 90 minutes PT Frequency: 5 out of 7 days PT Duration Estimated Length of Stay: 25-28 days PT Treatment/Interventions: Ambulation/gait training;Balance/vestibular training;Cognitive remediation/compensation;Discharge planning;DME/adaptive equipment instruction;Functional electrical stimulation;Functional mobility training;Neuromuscular re-education;Patient/family education;Splinting/orthotics;Stair training;Therapeutic Activities;Therapeutic Exercise;UE/LE Strength  taining/ROM;UE/LE Coordination activities;Visual/perceptual remediation/compensation;Wheelchair propulsion/positioning PT Transfers Anticipated Outcome(s): min assist overall PT Locomotion Anticipated Outcome(s): mod assist PT Recommendation Follow Up Recommendations: Home health PT;24 hour supervision/assistance Patient destination: Home Equipment Recommended: To be determined;Wheelchair (measurements);Wheelchair cushion (measurements)  Skilled Therapeutic Intervention Pt received sitting in  w/c.  Granddaugher-in-law present during session to assist with translation.  Note pt to be more expressively aphasic then receptive.  Would only respond in "yes" "no" or head shakes.  Performed bed mobility, gait training, sitting balance, and strength/sensation/coordination testing during session.  See details below.  Educated pts graddaughter-in-law about rehab schedule, what to expect and ELOS.  Verbalized understanding and translated to pt during session.    PT Evaluation Precautions/Restrictions Precautions Precautions: Fall Precaution Comments: dense R hemiplegia, pain in R wrist (maybe from fall), pain in R knee, expressive aphasia Restrictions Weight Bearing Restrictions: No General Chart Reviewed: Yes Family/Caregiver Present: Yes Vital Signs  Pain  Pt states R wrist pain during mobility. Repositioned at end of session.  Home Living/Prior Functioning Home Living Available Help at Discharge: Family;Available 24 hours/day Type of Home: House Home Access: Stairs to enter [employer] of Steps: 3 Entrance Stairs-Rails: Can reach both Home Layout: One level Additional Comments: has built in shower seat, vanity/sink beside of toilet to push from if needed  Lives With: Family Prior Function Level of Independence: Requires assistive device for independence  Able to Take Stairs?: Yes Driving: No Vocation: Retired Leisure: Hobbies-yes (Comment) Comments: Likes to play with her  grandchildren Vision/Perception     Cognition Overall Cognitive Status: Impaired/Different from baseline Arousal/Alertness: Awake/alert Orientation Level: Oriented to place;Oriented to person;Oriented to time Attention: Focused;Sustained Focused Attention: Appears intact Sustained Attention: Impaired Sustained Attention Impairment: Functional basic Awareness: Impaired Awareness Impairment: Emergent impairment Safety/Judgment: Impaired Comments: Had interpreter ask pt what she would do if she needed to get up, pt proceeded to attempt to get out of chair, therefore donned quick release belt for safety.  Sensation Sensation Light Touch: Impaired Detail Light Touch Impaired Details: Impaired RLE Proprioception: Impaired Detail Proprioception Impaired Details: Impaired RLE Additional Comments: Pt initially nodded "yes" that she could feel RLE, however when formally tested via interpreter, unable to correct state when light touch/prioprioception tested.  Coordination Gross Motor Movements are Fluid and Coordinated: No Fine Motor Movements are Fluid and Coordinated: No Heel Shin Test: Heel to shin is impaired, however feel it may partly be due to body habitus and increased difficulty getting LLE to RLE.   Motor  Motor Motor: Hemiplegia;Abnormal postural alignment and control Motor - Skilled Clinical Observations: Pt with dense R hemiplegia, LOB to the R with external perturbations  Mobility Bed Mobility Bed Mobility: Sit to Supine;Rolling Left;Left Sidelying to Sit Rolling Left: 2: Max assist Rolling Left Details: Verbal cues for sequencing;Verbal cues for technique;Tactile cues for initiation;Tactile cues for weight shifting;Tactile cues for placement;Manual facilitation for placement;Manual facilitation for weight bearing;Manual facilitation for weight shifting;Visual cues/gestures for sequencing Rolling Left Details (indicate cue type and reason): Pt requires total assist for  management of RUE/LE when rolling and max verbal and manual facilitation for shifting LEs/trunk into sidelying position.  Interpreter present to assist with translation.  Left Sidelying to Sit: 2: Max assist;HOB flat Left Sidelying to Sit Details: Tactile cues for initiation;Verbal cues for sequencing;Verbal cues for technique;Verbal cues for precautions/safety;Manual facilitation for placement;Manual facilitation for weight bearing;Manual facilitation for weight shifting Left Sidelying to Sit Details (indicate cue type and reason): Requires assist for BLEs off of mat table and also assist at hips/trunk to elevate trunk into sitting position.  Note that once movement initiated, pt able to use LUE to self assist in elevating trunk into sititng.   Sit to Supine: 3: Mod assist Sit to Supine - Details: Verbal cues for sequencing;Visual cues/gestures for sequencing;Tactile  cues for initiation;Verbal cues for precautions/safety;Manual facilitation for weight shifting;Manual facilitation for placement Sit to Supine - Details (indicate cue type and reason): Pt requires assist for RLE into bed and also some assist to lower trunk safely to mat table.  Provided more tactile and manual cues as well as demonstration cues for lying down.  Transfers Transfers: Yes Sit to Stand: 1: +2 Total assist;From bed;From chair/3-in-1 Sit to Stand Details: Verbal cues for sequencing;Verbal cues for technique;Verbal cues for precautions/safety;Manual facilitation for weight shifting;Manual facilitation for placement;Manual facilitation for weight bearing Sit to Stand Details (indicate cue type and reason): Requires assist on each side at waist to ensure safety and upright posture.  Also requires manual facilitation at R knee to prevent buckle.  Provided manual assist for increased forward weight shift and also facilitation for technique with scooting to edge of chair.   Stand to Sit: 1: +2 Total assist Stand to Sit Details  (indicate cue type and reason): Verbal cues for sequencing;Verbal cues for technique;Verbal cues for precautions/safety;Manual facilitation for weight shifting;Manual facilitation for placement;Manual facilitation for weight bearing Stand to Sit Details: see above Squat Pivot Transfers: 1: +2 Total assist Squat Pivot Transfer Details: Verbal cues for sequencing;Verbal cues for technique;Verbal cues for precautions/safety;Manual facilitation for weight shifting;Manual facilitation for placement;Manual facilitation for weight bearing;Tactile cues for placement Squat Pivot Transfer Details (indicate cue type and reason): Provided verbal, visual and tactile cues for technique when transferring to/from w/c.  Requires +2 assist, as she is unable to completely WB through BLEs due to dereased height of pt.  Provided manual faciliation for increased forward weight shift and assist at RLE to prevent knee buckle and to attempt to increase WB through RLE during transfer.   Locomotion  Ambulation Ambulation: Yes Ambulation/Gait Assistance: 1: +2 Total assist Ambulation Distance (Feet): 8 Feet (then another 5') Assistive device:  (L handrail in hallway) Ambulation/Gait Assistance Details: Verbal cues for sequencing;Verbal cues for technique;Verbal cues for precautions/safety;Verbal cues for gait pattern;Manual facilitation for weight shifting;Manual facilitation for placement;Manual facilitation for weight bearing;Tactile cues for initiation;Tactile cues for posture Ambulation/Gait Assistance Details: Provided more manual and tactile cues during gait training due to language barrier and to limit amount of distractions during gait training.  Provided manual assist for weight shifting R and L and also at R knee to prevent knee buckle and to assist with advancing RLE during gait.  +2 to ensure safety and to assist with maintaining upright posture.   Gait Gait: Yes Gait Pattern: Impaired Gait Pattern: Decreased stance  time - right;Decreased hip/knee flexion - right;Decreased dorsiflexion - right;Decreased weight shift to right;Trunk flexed;Narrow base of support;Right flexed knee in stance Stairs / Additional Locomotion Stairs: No Wheelchair Mobility Wheelchair Mobility: No Distance: Pt unable to attempt due to LEs not able to reach floor.   Trunk/Postural Assessment  Cervical Assessment Cervical Assessment: Within Functional Limits Postural Control Postural Control: Deficits on evaluation Postural Limitations: Pt with forward head and forward trunk lean during standing and gait training.  Able to correct with tactile and manual cues, however unable to maintain more than a few seconds.   Balance Balance Balance Assessed: Yes Static Sitting Balance Static Sitting - Balance Support: Bilateral upper extremity supported;Feet supported Static Sitting - Level of Assistance: 5: Stand by assistance Static Sitting - Comment/# of Minutes: Pt able to maintain stand by assist while sitting unsupported, however when given slight external perturbations towards the R, she would have LOB to the R and require min to mod assist  to correct.  Extremity Assessment      RLE Assessment RLE Assessment: Exceptions to [facility] RLE Strength RLE Overall Strength: Deficits RLE Overall Strength Comments: Pt with no active movement when formally testing strength.  Note some slight hip extension activation with standing.  Also may have some slight hip flex. Will further assess in SL.  LLE Assessment LLE Assessment: Within Functional Limits  FIM:  FIM - Bed/Chair Transfer Bed/Chair Transfer Assistive Devices: Arm rests Bed/Chair Transfer: 3: Supine > Sit: Mod A (lifting assist/Pt. 50-74%/lift 2 legs;2: Sit > Supine: Max A (lifting assist/Pt. 25-49%);1: Two helpers (two helpers for bed <> chair) FIM - Locomotion: Wheelchair Distance: Pt unable to attempt due to LEs not able to reach floor.  Locomotion: Wheelchair: 1: Total  Assistance/staff pushes wheelchair (Pt<25%) FIM - Locomotion: Ambulation Locomotion: Ambulation Assistive Devices:  (L handrail in hallway) Ambulation/Gait Assistance: 1: +2 Total assist Locomotion: Ambulation: 1: Two helpers FIM - Locomotion: Stairs Locomotion: Stairs: 0: Activity did not occur (not safe at this time)   Refer to Care Plan for Long Term Goals  Recommendations for other services: None  Discharge Criteria: Patient will be discharged from PT if patient refuses treatment 3 consecutive times without medical reason, if treatment goals not met, if there is a change in medical status, if patient makes no progress towards goals or if patient is discharged from hospital.  The above assessment, treatment plan, treatment alternatives and goals were discussed and mutually agreed upon: by patient and by family  Vista Deck 05/25/2013, 12:23 PM

## 2013-05-25 NOTE — Progress Notes (Signed)
Patient with acute renal insufficiency --CT and MRI done without contrast.  Baseline BUN/Cr at 17/0.7. Will hydrate with IVF and encourage patient's family  to push po fluids. Metformin discontinued due to acute renal failure. Will change to Amaryl and monitor for effectiveness. Was started on ranexa recently and renal failure may be SE of this. Rule out UTI and/or retention affecting renal function. Will check PVRs. UA negative and UCS pending.

## 2013-05-25 NOTE — Evaluation (Signed)
The assessment and plan has been reviewed and SLP is in agreement. Kesha Hurrell, M.A., CCC-SLP [phone]  

## 2013-05-25 NOTE — Evaluation (Signed)
Speech Language Pathology Assessment and Plan  Patient Details  Name: Stacie [name] MRN: [id number] Date of Birth: 07-Apr-1941  SLP Diagnosis: Aphasia;Cognitive Impairments;Dysphagia  Rehab Potential: Fair ELOS: 18 to 25 days   Today's Date: 05/25/2013 Time: 1102-1202 Time Calculation (min): 60 min  Problem List:  Patient Active Problem List   Diagnosis Date Noted  . CVA (cerebral infarction) 05/24/2013  . Type II or unspecified type diabetes mellitus with neurological manifestations, not stated as uncontrolled(250.60) 05/24/2013  . Dyslipidemia 05/24/2013  . GERD (gastroesophageal reflux disease) 05/24/2013  . Iron deficiency anemia, unspecified 05/24/2013   Past Medical History:  Past Medical History  Diagnosis Date  . Diabetes mellitus without complication   . GERD (gastroesophageal reflux disease)   . Right knee pain   . Angina pectoris   . Insomnia   . Frequency of urination    Past Surgical History:  Past Surgical History  Procedure Laterality Date  . Tendon Right     heel tendon surgery    Assessment / Plan / Recommendation Clinical Impression  Patient is a 72 y.o. year old non-english speaking [place] female with history of DM, HTN, angina (negative cath this summer); who was found on the floor by family with inability to speak or move her right side. She was taken to [facility] on 05/19/13 and MRI brain done revealing L-ACA infarct affecting medial frontal lobe and mild generalized small vessel disease. Carotid dopplers with <50% R-ICA stenosis. 2D echo with EF 55-60% with moderately elevated pulmonary artery pressures. Mild TVR. Neurology (Dr. 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 Patient continues to have dense right hemiparesis, is able to follow basic commands 25-50% accuracy (with interpreter) but mute. On mechanical soft diet. Therapies ongoing and working on pregait activities. Therapy team recommended  CIR and patient admitted today for intensive therapies. Patient transferred to CIR on 05/24/2013. Bedside-swallow and cognitive-linguistic evaluations completed on 05/25/2013.    SLP observed trails of Dys.3 and regular textures; patient demonstrated slightly prolonged mastication on both consistencies although delay in swallow initiation suspected.  Patient reported that regular textures were harder to chew and Dys.3 textures are recommended until she can manage without fatigue.  SLP also observed trials of thin liquids via cup and spoon; patient required cues to take small bites and sips.  No overt s/s of aspiration were observed.  Linguistically, patient demonstrated a mild to moderate language impairment as she had trouble with divergent naming in addition to her repeating questions without answering them.  Patient answered yes/no questions and followed commands accurately.  Cognitively, patient demonstrated memory, processing, and problem solving impairments.  As per granddaughter report, patient completed her own money and medication management at home and traveled to [place] regularly.  It is recommended that patient receive skilled SLP intervention focusing on the following: language abilities to communicate basic wants/needs; solving basic and complex problems; memory compensatory strategies.  Addressing previously stated deficits in skilled therapy will maximize her overall safety and functional independence, and to reduce burden of care upon her discharge.    SLP Assessment  Patient will need skilled Speech Lanaguage Pathology Services during CIR admission    Recommendations  Diet Recommendations: Dysphagia 3 (Mechanical Soft);Thin liquid Liquid Administration via: Cup;Straw Medication Administration: Crushed with puree Supervision: Patient able to self feed;Staff to assist with self feeding;Full supervision/cueing for compensatory strategies;Trained caregiver to feed  patient Compensations: Slow rate;Small sips/bites Postural Changes and/or Swallow Maneuvers: Seated upright 90 degrees  Oral Care Recommendations: Oral care BID Patient destination: Home Follow up Recommendations: Home Health SLP;24 hour supervision/assistance Equipment Recommended: None recommended by SLP    SLP Frequency 5 out of 7 days   SLP Treatment/Interventions Cognitive remediation/compensation;Cueing hierarchy;Dysphagia/aspiration precaution training;Functional tasks;Internal/external aids;Medication managment;Patient/family education;Therapeutic Activities    Pain Pain Assessment Pain Assessment: No/denies pain Pain Score: 0-No pain Prior Functioning Cognitive/Linguistic Baseline: Within functional limits Type of Home: House  Lives With: Family Available Help at Discharge: Family;Available 24 hours/day Vocation: Retired  Teacher, music Term Goals: Week 1: SLP Short Term Goal 1 (Week 1): Patient will solve basic problems with Min verbal and questioning cues. SLP Short Term Goal 2 (Week 1): Patient will utilize memory aids to recall basic, daily information with Min verbal and questioning cues. SLP Short Term Goal 3 (Week 1): Patient will verbalize basic wants/needs with Mod verbal and questioning cues.  See FIM for current functional status Refer to Care Plan for Long Term Goals  Recommendations for other services: None  Discharge Criteria: Patient will be discharged from SLP if patient refuses treatment 3 consecutive times without medical reason, if treatment goals not met, if there is a change in medical status, if patient makes no progress towards goals or if patient is discharged from hospital.  The above assessment, treatment plan, treatment alternatives and goals were discussed and mutually agreed upon: by patient and by family  [name] 05/25/2013, 1:09 PM

## 2013-05-25 NOTE — Progress Notes (Signed)
Physical Therapy Session Note  Patient Details  Name: Stacie [name] MRN: [id number] Date of Birth: 05-15-1941  Today's Date: 05/25/2013 Time: 1191-4782 Time Calculation (min): 21 min  Short Term Goals: Week 1:  PT Short Term Goal 1 (Week 1): Pt will perform bed mobility at mod assist consistently with HOB flat PT Short Term Goal 2 (Week 1): Pt will perform squat pivot transfer at max assist R or L with safe technique PT Short Term Goal 3 (Week 1): Pt will perform dynamic sitting balance at min assist PT Short Term Goal 4 (Week 1): Pt will perform standing balance with max assist of one person to perform functional activities PT Short Term Goal 5 (Week 1): Pt will ambulate x 20' w/ LRAD with max assist (+2 for chair follow)  Skilled Therapeutic Interventions/Progress Updates:  Pt received in bed this afternoon sleeping, but was easily aroused by voice.  Granddaughter-in-law also in room this afternoon to assist with translation.  Attempted to place shoes on pt while in bed, however note that feet may be swollen and were not able to be placed.  Performed supine to R SL position with max assist for shifting hips/trunk over to R side.  Also requires tactile cues for reaching LUE across body and flexing L knee to roll.  Assisted R UE and LE during rolling for safety.  Provided manual and verbal cues for lowering LEs out of bed.  Pt able to initiate moving LLE out of bed, however requires assist for RLE and also for trunk.  Provided tactile cues for using LUE to push on bed to elevate trunk, however continues to require max assist to fully elevate trunk into sitting.  Once at EOB, provided cues for scooting to EOB for increased BLE contact with floor prior to transfer.  Pt able to initiate movement of L hip forward, however requires manual assist for weight shift and movement of R hip.  Provided cues for chair set up and removal of arm rest and also to scoot closer to chair prior to  getting into chair.  In addition to cues, provided visual gestures and manual facilitation for increased forward weight shift, assist at R knee to prevent buckle and to increase WB through knee and to grab for L arm rest with L hand.  Pt left in chair with half lap tray for RUE support and quick release belt in place for safety.  All needs in reach with granddaughter present.   Therapy Documentation Precautions:  Precautions Precautions: Fall Precaution Comments: dense R hemiplegia, pain in R wrist (maybe from fall), pain in R knee, expressive aphasia Restrictions Weight Bearing Restrictions: No General: Amount of Missed PT Time (min): 9 Minutes Missed Time Reason:  (PT late from mtg) Vital Signs: Therapy Vitals Temp: 98.4 F (36.9 C) Temp src: Oral Pulse Rate: 78 Resp: 18 BP: 142/48 mmHg Patient Position, if appropriate: Sitting Oxygen Therapy SpO2: 97 % O2 Device: None (Room air) Pain: Pain Assessment Pain Assessment: No/denies pain Pain Score: 0-No pain  See FIM for current functional status  Therapy/Group: Individual Therapy  Vista Deck 05/25/2013, 4:10 PM

## 2013-05-25 NOTE — Progress Notes (Signed)
Orthopedic Tech Progress Note Patient Details:  Stacie [name] 02/09/1941 [id number] [facility] called for brace order.  Patient ID: Stacie [name], female   DOB: Apr 20, 1941, 72 y.o.   MRN: [id number]   Stacie [name] 05/25/2013, 11:00 AM

## 2013-05-25 NOTE — Progress Notes (Signed)
Social Work Assessment and Plan Social Work Assessment and Plan  Patient Details  Name: Stacie [name] MRN: [id number] Date of Birth: 04/09/1941  Today's Date: 05/25/2013  Problem List:  Patient Active Problem List   Diagnosis Date Noted  . CVA (cerebral infarction) 05/24/2013  . Type II or unspecified type diabetes mellitus with neurological manifestations, not stated as uncontrolled(250.60) 05/24/2013  . Dyslipidemia 05/24/2013  . GERD (gastroesophageal reflux disease) 05/24/2013  . Iron deficiency anemia, unspecified 05/24/2013   Past Medical History:  Past Medical History  Diagnosis Date  . Diabetes mellitus without complication   . GERD (gastroesophageal reflux disease)   . Right knee pain   . Angina pectoris   . Insomnia   . Frequency of urination    Past Surgical History:  Past Surgical History  Procedure Laterality Date  . Tendon Right     heel tendon surgery   Social History:  has no tobacco, alcohol, and drug history on file.  Family / Support Systems Marital Status: Single Patient Roles: Parent;Caregiver Children: Melissa Chavez-granddaughter  [phone] Other Supports: Lilli Light Anticipated Caregiver: Family members Ability/Limitations of Caregiver: Aware pt will require 24 hour care at discharge Caregiver Availability: 24/7 Family Dynamics: Close knit,big family who are involved and supportive.  Trying to figure out who will be providing the care at discharge.  Pt was providing the care for her 69 year old great grandson prior to admission.  Very active prior to this stroke  Social History Preferred language: Unknown Religion: Unknown Cultural Background: From [place] here almost 5 years.  Travels back and forth Education: Some schooling Read: Yes (Spanish) Write: Yes (Spanish) Employment Status: Retired Fish farm manager Issues: Has green card and travels back and forth  between Guardian/Conservator: None-according to MD pt is not capable of mkaking her own decisions while here.  Will look to children since no formal POA   Abuse/Neglect Physical Abuse: Denies Verbal Abuse: Denies Sexual Abuse: Denies Exploitation of patient/patient's resources: Denies Self-Neglect: Denies  Emotional Status Pt's affect, behavior adn adjustment status: Pt is eating her lunch ans smiles-answers in single words through interpreter, granddaughter in-law also present.  She reports; " She has always been the caregiver of others and never sick." Recent Psychosocial Issues: Other medical issues-language barrier, along with cognitive issues from the stroke Pyschiatric History: No history according to granddaughter in-law deferred depression screen due to lanugage and cognitive issues-awaiting speech evaluation also. Substance Abuse History: No issues-according to granddaughter in-law  Patient / Family Perceptions, Expectations & Goals Pt/Family understanding of illness & functional limitations: Family has been very involved and supportive, someone is always here with pt.  They also welcome an interpreter to answer others in the families questions who do not speak [place]. Seem to have a good understanding of her deficits. Premorbid pt/family roles/activities: Mother, grandmother, Engineer, structural, retiree, etc Anticipated changes in roles/activities/participation: Resume Pt/family expectations/goals: Pt states: " get well."  Granddaughter in-law states: " We are hopeful she will do well here and get as independent as possible by the time she leaves here."  [employer]: Other (Comment) (Medical Care through [facility]) Premorbid Home Care/DME Agencies: None Transportation available at discharge: Family Resource referrals recommended: Support group (specify) (CVA Support group)  Discharge Planning Living Arrangements: Other relatives Support Systems:  Children;Other relatives;Church/faith community Type of Residence: Private residence Insurance Resources: Customer service manager Resources: Family Support Financial Screen Referred: Yes Living Expenses: Lives with family Money Management: Family Does the patient have  any problems obtaining your medications?: Yes (Describe) (No insurance-sliding fee scale clinic) Home Management: Self and family members  Patient/Family Preliminary Plans: Return home with a family member, may need to go to another members home due to will need 24 hour care at discharge from here.  Informed family she will need 24 hour care at discharge. Social Work Anticipated Follow Up Needs: HH/OP;Support Group  Clinical Impression Pleasant female enjoying her lunch, supportive family present.  Many questions regarding finances, insurance, care needs.  Will continue to have an interpreter present for therapies and when MD rounds-family Request to obtain information and be aware of pt's condition.  Encouraged to attend therapies and observe her progress, if interfere aware will ask to step out.  Will have financial counselor meet with to answer questions Regarding medicaid and if eligible for other resources.  Work on a safe discharge plan.  Lucy Chris 05/25/2013, 4:07 PM

## 2013-05-25 NOTE — Progress Notes (Signed)
Patient's family reports problems voiding this am and need for cath--1450 cc of malodorous urine documented. Urinary retention most likely cause of acute renal failure. Patient with premorbid frequency issues. Likely has neurogenic bladder with symptoms exacerbated due to stroke and immobility. Will set toileting schedule in addition to PVR checks.

## 2013-05-25 NOTE — Care Management Note (Signed)
[facility] Individual Statement of Services  Patient Name:  Stacie [name]  Date:  05/25/2013  Welcome to the [facility].  Our goal is to provide you with an individualized program based on your diagnosis and situation, designed to meet your specific needs.  With this comprehensive rehabilitation program, you will be expected to participate in at least 3 hours of rehabilitation therapies Monday-Friday, with modified therapy programming on the weekends.  Your rehabilitation program will include the following services:  Physical Therapy (PT), Occupational Therapy (OT), Speech Therapy (ST), 24 hour per day rehabilitation nursing, Case Management (Social Worker), Rehabilitation Medicine, Nutrition Services and Pharmacy Services  Weekly team conferences will be held on Wednesday to discuss your progress.  Your Social Worker will talk with you frequently to get your input and to update you on team discussions.  Team conferences with you and your family in attendance may also be held.  Expected length of stay: 23-25 days Overall anticipated outcome: min level of assist  Depending on your progress and recovery, your program may change. Your Social Worker will coordinate services and will keep you informed of any changes. Your Social Worker's name and contact numbers are listed  below.  The following services may also be recommended but are not provided by the [facility]:    Home Health Rehabiltiation Services  Outpatient Rehabilitation Services    Arrangements will be made to provide these services after discharge if needed.  Arrangements include referral to agencies that provide these services.  Your insurance has been verified to be:  None Your primary doctor is:  Dr Fulton Mole  Pertinent information will be shared with your doctor and your insurance company.  Social Worker:  Dossie Der, SW [phone]  or ([phone]  Information discussed with and copy given to patient by: Lucy Chris, 05/25/2013, 1:06 PM

## 2013-05-25 NOTE — Progress Notes (Signed)
PHARMACIST - PHYSICIAN COMMUNICATION DR:   CONCERNING:  METFORMIN SAFE ADMINISTRATION POLICY  RECOMMENDATION: Metformin has been placed on DISCONTINUE (rejected order) STATUS and should be reordered only after any of the conditions below are ruled out.  Current safety recommendations include avoiding metformin for a minimum of 48 hours after the patient's exposure to intravenous contrast media.  DESCRIPTION:  The Pharmacy Committee has adopted a policy that restricts the use of metformin in hospitalized patients until all the contraindications to administration have been ruled out. Specific contraindications are: [] Serum creatinine ? 1.5 for males [x] Serum creatinine ? 1.4 for females [] Shock, acute MI, sepsis, hypoxemia, dehydration [] Planned administration of intravenous iodinated contrast media [] Heart Failure patients with low EF [] Acute or chronic metabolic acidosis (including DKA)      

## 2013-05-26 ENCOUNTER — Inpatient Hospital Stay (HOSPITAL_COMMUNITY): Payer: Self-pay | Admitting: Rehabilitation

## 2013-05-26 ENCOUNTER — Inpatient Hospital Stay (HOSPITAL_COMMUNITY): Payer: Medicaid Other

## 2013-05-26 ENCOUNTER — Inpatient Hospital Stay (HOSPITAL_COMMUNITY): Payer: Medicaid Other | Admitting: Speech Pathology

## 2013-05-26 LAB — GLUCOSE, CAPILLARY
Glucose-Capillary: 198 mg/dL — ABNORMAL HIGH (ref 70–99)
Glucose-Capillary: 276 mg/dL — ABNORMAL HIGH (ref 70–99)
Glucose-Capillary: 253 mg/dL — ABNORMAL HIGH (ref 70–99)
Glucose-Capillary: 339 mg/dL — ABNORMAL HIGH (ref 70–99)

## 2013-05-26 LAB — BASIC METABOLIC PANEL
BUN: 25 mg/dL — ABNORMAL HIGH (ref 6–23)
CO2: 25 mEq/L (ref 19–32)
Calcium: 9 mg/dL (ref 8.4–10.5)
Creatinine, Ser: 0.78 mg/dL (ref 0.50–1.10)
GFR calc Af Amer: >90 mL/min (ref >90)
GFR calc non Af Amer: 82 mL/min — ABNORMAL LOW (ref >90)
Sodium: 141 mEq/L (ref 135–145)

## 2013-05-26 LAB — URINE CULTURE: Culture: NO GROWTH

## 2013-05-26 NOTE — Progress Notes (Signed)
Speech Language Pathology Daily Session Note  Patient Details  Name: Stacie [name] MRN: [id number] Date of Birth: 10/03/1940  Today's Date: 05/26/2013 Time: 1000-1100 Time Calculation (min): 60 min  Short Term Goals: Week 1: SLP Short Term Goal 1 (Week 1): Patient will solve basic problems with Min verbal and questioning cues. SLP Short Term Goal 2 (Week 1): Patient will utilize memory aids to recall basic, daily information with Min verbal and questioning cues. SLP Short Term Goal 3 (Week 1): Patient will verbalize basic wants/needs with Mod verbal and questioning cues.  Skilled Therapeutic Interventions: Skilled treatment session focused on cognition and language goals.  SLP facilitated session with object identification task from a field of 3; patient required Mod I to answer 5/5 correct.  SLP also facilitated session with having patient identify which object to use for a function, in addition to identifying function of an object; patient responded 8/8 correctly.  Cognitively, SLP facilitated session with basic money management task; patient required Min verbal cues to recall task rule, and Mod questioning and verbal cues to self-regulate and correct during task.  Patient required Mod verbal and questioning cues to initiate conversation and answer questions regarding picture cards and familiar tasks.  SLP discussed with patient what a stroke was and how therapy will facilitate her recovery; patient verbalized understanding and carry-over will be addressed in upcoming sessions.  Patient demonstrated intellectual awareness as she stated that her arm and leg don't move; although safety is still a concern as she verbalized she can get up and go to the bathroom by herself.  Interpreter and granddaughter-in-law reported that patient's verbalizations were understandable and grammatically correct during session.  Continue with current plan of care.       FIM:   Comprehension Comprehension Mode: Auditory Comprehension: 3-Understands basic 50 - 74% of the time/requires cueing 25 - 50%  of the time Expression Expression Mode: Verbal Expression: 3-Expresses basic 50 - 74% of the time/requires cueing 25 - 50% of the time. Needs to repeat parts of sentences. Social Interaction Social Interaction: 4-Interacts appropriately 75 - 89% of the time - Needs redirection for appropriate language or to initiate interaction. Problem Solving Problem Solving: 3-Solves basic 50 - 74% of the time/requires cueing 25 - 49% of the time Memory Memory: 3-Recognizes or recalls 50 - 74% of the time/requires cueing 25 - 49% of the time  Pain Pain Assessment Pain Assessment: No/denies pain Pain Score: 0-No pain  Therapy/Group: Individual Therapy  [name] 05/26/2013, 12:20 PM

## 2013-05-26 NOTE — Progress Notes (Addendum)
Subjective/Complaints: Slept ok, Pro interpreter translating, pt brighter more alert and talkative this am Answered question regarding stroke recovery and rehab process Wrist pain better in srist hand orthosis  Small vessel infarct with hx DM (which causes SVD), doubt Ranexa since this can cause arrythmia and this wasn't embolic CVA. Review of Systems - Negative except r side weakness Objective: Vital Signs: Blood pressure 118/62, pulse 71, temperature 95.5 F (35.3 C), temperature source Oral, resp. rate 18, weight 82.3 kg (181 lb 7 oz), SpO2 93.00%. No results found. Results for orders placed during the hospital encounter of 05/24/13 (from the past 72 hour(s))  GLUCOSE, CAPILLARY     Status: Abnormal   Collection Time    05/24/13  9:35 PM      Result Value Range   Glucose-Capillary 287 (*) 70 - 99 mg/dL   Comment 1 Notify RN    URINALYSIS, ROUTINE W REFLEX MICROSCOPIC     Status: None   Collection Time    05/25/13  6:15 AM      Result Value Range   Color, Urine YELLOW  YELLOW   APPearance CLEAR  CLEAR   Specific Gravity, Urine 1.012  1.005 - 1.030   pH 5.0  5.0 - 8.0   Glucose, UA NEGATIVE  NEGATIVE mg/dL   Hgb urine dipstick NEGATIVE  NEGATIVE   Bilirubin Urine NEGATIVE  NEGATIVE   Ketones, ur NEGATIVE  NEGATIVE mg/dL   Protein, ur NEGATIVE  NEGATIVE mg/dL   Urobilinogen, UA 0.2  0.0 - 1.0 mg/dL   Nitrite NEGATIVE  NEGATIVE   Leukocytes, UA NEGATIVE  NEGATIVE   Comment: MICROSCOPIC NOT DONE ON URINES WITH NEGATIVE PROTEIN, BLOOD, LEUKOCYTES, NITRITE, OR GLUCOSE <1000 mg/dL.  CBC WITH DIFFERENTIAL     Status: Abnormal   Collection Time    05/25/13  6:58 AM      Result Value Range   WBC 11.8 (*) 4.0 - 10.5 K/uL   RBC 4.56  3.87 - 5.11 MIL/uL   Hemoglobin 10.4 (*) 12.0 - 15.0 g/dL   HCT 21.3 (*) 08.6 - 57.8 %   MCV 70.0 (*) 78.0 - 100.0 fL   MCH 22.8 (*) 26.0 - 34.0 pg   MCHC 32.6  30.0 - 36.0 g/dL   RDW 46.9  62.9 - 52.8 %   Platelets 203  150 - 400 K/uL   Neutrophils Relative % 56  43 - 77 %   Lymphocytes Relative 27  12 - 46 %   Monocytes Relative 13 (*) 3 - 12 %   Eosinophils Relative 4  0 - 5 %   Basophils Relative 0  0 - 1 %   Neutro Abs 6.6  1.7 - 7.7 K/uL   Lymphs Abs 3.2  0.7 - 4.0 K/uL   Monocytes Absolute 1.5 (*) 0.1 - 1.0 K/uL   Eosinophils Absolute 0.5  0.0 - 0.7 K/uL   Basophils Absolute 0.0  0.0 - 0.1 K/uL   Smear Review MORPHOLOGY UNREMARKABLE    COMPREHENSIVE METABOLIC PANEL     Status: Abnormal   Collection Time    05/25/13  6:58 AM      Result Value Range   Sodium 138  135 - 145 mEq/L   Potassium 4.3  3.5 - 5.1 mEq/L   Chloride 100  96 - 112 mEq/L   CO2 24  19 - 32 mEq/L   Glucose, Bld 220 (*) 70 - 99 mg/dL   BUN 50 (*) 6 - 23 mg/dL   Creatinine, Ser  1.54 (*) 0.50 - 1.10 mg/dL   Calcium 9.2  8.4 - 16.1 mg/dL   Total Protein 7.3  6.0 - 8.3 g/dL   Albumin 3.0 (*) 3.5 - 5.2 g/dL   AST 15  0 - 37 U/L   ALT 10  0 - 35 U/L   Alkaline Phosphatase 64  39 - 117 U/L   Total Bilirubin 0.4  0.3 - 1.2 mg/dL   GFR calc non Af Amer 33 (*) >90 mL/min   GFR calc Af Amer 38 (*) >90 mL/min   Comment: (NOTE)     The eGFR has been calculated using the CKD EPI equation.     This calculation has not been validated in all clinical situations.     eGFR's persistently <90 mL/min signify possible Chronic Kidney     Disease.  VITAMIN B12     Status: None   Collection Time    05/25/13  6:58 AM      Result Value Range   Vitamin B-12 559  211 - 911 pg/mL   Comment: Performed at [facility]  GLUCOSE, CAPILLARY     Status: Abnormal   Collection Time    05/25/13  7:38 AM      Result Value Range   Glucose-Capillary 189 (*) 70 - 99 mg/dL   Comment 1 Notify RN    GLUCOSE, CAPILLARY     Status: Abnormal   Collection Time    05/25/13 11:45 AM      Result Value Range   Glucose-Capillary 297 (*) 70 - 99 mg/dL   Comment 1 Notify RN    GLUCOSE, CAPILLARY     Status: Abnormal   Collection Time    05/25/13  4:35 PM      Result  Value Range   Glucose-Capillary 183 (*) 70 - 99 mg/dL   Comment 1 Notify RN    GLUCOSE, CAPILLARY     Status: Abnormal   Collection Time    05/25/13  8:41 PM      Result Value Range   Glucose-Capillary 201 (*) 70 - 99 mg/dL  GLUCOSE, CAPILLARY     Status: Abnormal   Collection Time    05/26/13  7:22 AM      Result Value Range   Glucose-Capillary 199 (*) 70 - 99 mg/dL   Comment 1 Notify RN       HEENT: normal Cardio: RRR and no murmur Resp: CTA B/L and unlabored GI: BS positive and non distended Extremity:  Pulses positive and No Edema Skin:   Intact Neuro: Alert/Oriented, Cranial Nerve Abnormalities R central 7,, Abnormal Sensory reduced sensory on Right side and Abnormal Motor 0/5 in RUE, trace hip ext otherwise 0/5 RLE Musc/Skel:  Other pain with R wrist ROM, no swelling or erythema Gen NAD   Assessment/Plan: 1. 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 Physiatrist is providing close team supervision and 24 hour management of active medical problems listed below. Physiatrist and rehab team continue to assess barriers to discharge/monitor patient progress toward functional and medical goals. Team conference today please see physician documentation under team conference tab, met with team face-to-face to discuss problems,progress, and goals. Formulized individual treatment plan based on medical history, underlying problem and comorbidities. FIM: FIM - Bathing Bathing Steps Patient Completed: Chest;Right Arm;Abdomen;Left upper leg Bathing: 1: Two helpers  FIM - Upper Body Dressing/Undressing Upper  body dressing/undressing: 1: Total-Patient completed less than 25% of tasks FIM - Lower Body Dressing/Undressing Lower body dressing/undressing: 1: Two helpers  FIM - Toileting Toileting: 1: Two helpers  FIM - Transport planner Transfers: 1-Two helpers  FIM - Banker Devices: Arm rests Bed/Chair Transfer: 2: Supine > Sit: Max A (lifting assist/Pt. 25-49%);1: Bed > Chair or W/C: Total A (helper does all/Pt. < 25%)  FIM - Locomotion: Wheelchair Distance: Pt unable to attempt due to LEs not able to reach floor.  Locomotion: Wheelchair: 1: Total Assistance/staff pushes wheelchair (Pt<25%) FIM - Locomotion: Ambulation Locomotion: Ambulation Assistive Devices:  (L handrail in hallway) Ambulation/Gait Assistance: 1: +2 Total assist Locomotion: Ambulation: 1: Two helpers  Comprehension Comprehension Mode: Auditory Comprehension: 3-Understands basic 50 - 74% of the time/requires cueing 25 - 50%  of the time  Expression Expression Mode: Verbal Expression: 3-Expresses basic 50 - 74% of the time/requires cueing 25 - 50% of the time. Needs to repeat parts of sentences.  Social Interaction Social Interaction Mode: Asleep Social Interaction: 4-Interacts appropriately 75 - 89% of the time - Needs redirection for appropriate language or to initiate interaction.  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 DVT Prophylaxis/Anticoagulation: Pharmaceutical: Lovenox  2. Pain Management: Will continue prn tramadol for chronic RLE/right knee pain.  3. Mood: difficulty to ascertain with language as well as language barrier. Family to assist with input. LCSW to follow for evaluation. Appeared to be fairly up beat on examination today. .  4. Neuropsych: This patient is not capable of making decisions on her own behalf.  5. HTN: Will monitor with bid checks. 6. DM type 2: Continue  metformin bid. Monitor BS with ac/hs checks and use SSI for elevated BS. Titrate as indicated for BS control.  7. H/o angina: Will continue Renexa.  8. Iron deficiency anemia: will add iron supplement. Check Vit B12 levels.  9.  Obstructive uropathy resolved after starting I/O cath will adjust freq to keep vol around    LOS (Days) 2 A FACE TO FACE EVALUATION WAS PERFORMED  Cheikh Bramble E 05/26/2013, 9:03 AM

## 2013-05-26 NOTE — Progress Notes (Signed)
Occupational Therapy Session Note  Patient Details  Name: Stacie [name] MRN: [id number] Date of Birth: [birth year]  Today's Date: 05/26/2013 Time: 1100-1200 Time Calculation (min): 60 min  Short Term Goals: Week 1:  OT Short Term Goal 1 (Week 1): Pt will perform UB dressing with min assist following hemi techniques. OT Short Term Goal 2 (Week 1): Pt will donn pullover pants with mod assist using AE PRN. OT Short Term Goal 3 (Week 1): Pt will transfer to drop arm commode with max assist. OT Short Term Goal 4 (Week 1): Pt will perform toilet hygiene and clothing management with max assist sit to stand. OT Short Term Goal 5 (Week 1): Pt will tolerate RUE splint wear for increased positioning.  Skilled Therapeutic Interventions/Progress Updates:    Pt sitting in w/c with granddaughter and interpreter present.  Pt engaged in BADL retraining including bathing and dressing w/c level at sink.  Pt presented with dense Rt hemiplegia.  HHA incorporated with RUE to assist with bathing tasks but no active movement detected.  Pt required tot A + 2 for bathing perineal area and for pulling up pants.  Pt required min A to lean forward in w/c to facilitate performing grooming tasks at sink and in preparation for sit<>stand and transfers. Pt cooperative and followed one step commands through interpreter.  Pt required tot A + 2 for squat pivot transfer to bed at end of session. Focus on activity tolerance, sitting balance, task initiation, sit<>stand, transfers, and safety awareness.  Therapy Documentation Precautions:  Precautions Precautions: Fall Precaution Comments: dense R hemiplegia, pain in R wrist (maybe from fall), pain in R knee, expressive aphasia Restrictions Weight Bearing Restrictions: No   Pain: Pt c/o pain in Rt shoulder but unable to rate; repositioned and RN aware  See FIM for current functional status  Therapy/Group: Individual Therapy  Rich Brave 05/26/2013, 2:14 PM

## 2013-05-26 NOTE — Progress Notes (Signed)
Physical Therapy Session Note  Patient Details  Name: Stacie [name] MRN: [id number] Date of Birth: January 11, 1941  Today's Date: 05/26/2013 Time: 1191-4782 Time Calculation (min): 34 min  Short Term Goals: Week 1:  PT Short Term Goal 1 (Week 1): Pt will perform bed mobility at mod assist consistently with HOB flat PT Short Term Goal 2 (Week 1): Pt will perform squat pivot transfer at max assist R or L with safe technique PT Short Term Goal 3 (Week 1): Pt will perform dynamic sitting balance at min assist PT Short Term Goal 4 (Week 1): Pt will perform standing balance with max assist of one person to perform functional activities PT Short Term Goal 5 (Week 1): Pt will ambulate x 20' w/ LRAD with max assist (+2 for chair follow)  Skilled Therapeutic Interventions/Progress Updates:   Pt received lying in bed finishing breakfast this morning with interpreter present then granddaughter in law present for remainder of session.  Pt reluctantly agreeable to therapy this morning with min encouragement.  Performed supine to R SL at max assist, however pt did initiate more movement with LUE crossing body and also performed L knee flex with min tactile cues.  Continues to require max assist for shifting hips/trunk into complete R SL.  Once in SL position, requires total assist to elevate trunk into sitting.  She is unable to fully use LUE due to short arm length and body habitus and tends to move L hand from bed to around therapist during elevating trunk.  Performed squat pivot transfer bed to chair going to the L with use of small step under LEs to improve WB through LEs for better leverage during transfer.  Note some improvement, however continues to require total assist with facilitation for forward weight shift, assist at R knee to prevent forward translation/buckle and to increase WB through RLE.   Also cues for L hand placement to self assist with pulling trunk/hips towards chair.   Attempted to provide less verbal cues and increased manual cues with some slight improvement noted.  Assisted to gym to perform squat pivot transfer <> mat surface for increased carryover with technique.  Continues to require total assist with small block under LEs w/c > mat, however due to flaccid R side, transferring to R requires +2 assist and use of slideboard to complete transfer.  Assisted back to room and left in w/c with quick release belt donned and all needs in reach.  Pt continues to demonstrate decreased expressive verbalization, but was able to state what she had for breakfast this morning.  Also note that she continues to have memory issues and does not remember having stroke.  Family continues to re-orient to situation.    Therapy Documentation Precautions:  Precautions Precautions: Fall Precaution Comments: dense R hemiplegia, pain in R wrist (maybe from fall), pain in R knee, expressive aphasia Restrictions Weight Bearing Restrictions: No   Pain: Pain Assessment Pain Assessment: 0-10 Pain Score: 6  Pain Type: Acute pain Pain Location: Elbow Pain Orientation: Right Pain Descriptors / Indicators: Aching Pain Intervention(s): Medication (See eMAR)   See FIM for current functional status  Therapy/Group: Individual Therapy  Vista Deck 05/26/2013, 10:51 AM

## 2013-05-26 NOTE — IPOC Note (Addendum)
Overall Plan of Care [facility]) Patient Details Name: Stacie [name] MRN: [id number] DOB: 1940/10/08  Admitting Diagnosis: L CVA  Hospital Problems: Principal Problem:   CVA (cerebral infarction) Active Problems:   Type II or unspecified type diabetes mellitus with neurological manifestations, not stated as uncontrolled(250.60)   Dyslipidemia   GERD (gastroesophageal reflux disease)   Iron deficiency anemia, unspecified     Functional Problem List: Nursing Bladder;Bowel;Perception  PT Balance;Endurance;Motor;Pain;Safety  OT Balance;Cognition;Endurance;Motor;Vision;Sensory;Safety;Pain  SLP Cognition;Linguistic;Safety  TR         Basic ADL's: OT Eating;Grooming;Bathing;Dressing;Toileting     Advanced  ADL's: OT       Transfers: PT Bed Mobility;Bed to Chair;Car  OT Toilet;Tub/Shower     Locomotion: PT Ambulation;Wheelchair Mobility;Stairs     Additional Impairments: OT Fuctional Use of Upper Extremity  SLP Communication;Social Cognition;Swallowing comprehension;expression Problem Solving;Memory  TR      Anticipated Outcomes Item Anticipated Outcome  Self Feeding modified independent  Swallowing  Mod I   Basic self-care  min assist  Toileting  min assist   Bathroom Transfers min assist  Bowel/Bladder  Min A  Transfers  min assist overall  Locomotion  mod assist  Communication  Min A  Cognition  Min A  Pain  <3  Safety/Judgment  Supervision   Therapy Plan: PT Intensity: Minimum of 1-2 x/day ,45 to 90 minutes PT Frequency: 5 out of 7 days PT Duration Estimated Length of Stay: 25-28 days OT Intensity: Minimum of 1-2 x/day, 45 to 90 minutes OT Frequency: 5 out of 7 days OT Duration/Estimated Length of Stay: 25-28 days SLP Intensity: Minumum of 1-2 x/day, 30 to 90 minutes SLP Frequency: 5 out of 7 days SLP Duration/Estimated Length of Stay: 18 to 25 days       Team Interventions: Nursing Interventions Patient/Family  Education;Bladder Management;Bowel Management;Disease Management/Prevention  PT interventions Ambulation/gait training;Balance/vestibular training;Cognitive remediation/compensation;Discharge planning;DME/adaptive equipment instruction;Functional electrical stimulation;Functional mobility training;Neuromuscular re-education;Patient/family education;Splinting/orthotics;Stair training;Therapeutic Activities;Therapeutic Exercise;UE/LE Strength taining/ROM;UE/LE Coordination activities;Visual/perceptual remediation/compensation;Wheelchair propulsion/positioning  OT Interventions Social worker;Therapeutic Activities;Therapeutic Exercise;UE/LE Strength taining/ROM;UE/LE Coordination activities;Community reintegration;Functional electrical stimulation;Neuromuscular re-education;Patient/family education;Self Care/advanced ADL retraining;Splinting/orthotics;Pain management  SLP Interventions Cognitive remediation/compensation;Cueing hierarchy;Dysphagia/aspiration precaution training;Functional tasks;Internal/external aids;Medication managment;Patient/family education;Therapeutic Activities  TR Interventions  recreation/leisure participation, therapeutic activities, balance training, adaptive equipment use, UE use/coordination, cognitive remediation/compensation, community reintegration, pt/family education, psychosocial support  SW/CM Interventions Discharge Planning;Psychosocial Support;Patient/Family Education    Team Discharge Planning: Destination: PT-Home ,OT- Home , SLP-Home Projected Follow-up: PT-Home health PT;24 hour supervision/assistance, OT-  Home health OT, SLP-Home Health SLP;24 hour supervision/assistance Projected Equipment Needs: PT-To be determined;Wheelchair (measurements);Wheelchair cushion (measurements), OT- 3 in 1 bedside comode;Tub/shower bench, SLP-None recommended by SLP Patient/family involved in discharge planning: PT- Patient;Family  member/caregiver,  OT-Patient;Family member/caregiver, SLP-Family member/caregiver;Patient  MD ELOS: 3 wks Medical Rehab Prognosis:  Good Assessment: 72 year old non-english speaking [place] female with history of DM, HTN, angina (negative cath this summer); who was found on the floor by family with inability to speak or move her right side. She was taken to [facility] on 05/19/13 and MRI brain done revealing L-ACA infarct affecting medial frontal lobe and mild generalized small vessel disease. Carotid dopplers with <50% R-ICA stenosis. 2D echo with EF 55-60% with moderately elevated pulmonary artery pressures. Mild TVR. Neurology (Dr. 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 Patient continues to have dense right hemiparesis, is able to follow basic commands 25-50% accuracy    Now requiring 24/7 Rehab RN,MD, as well as CIR level  PT, OT and SLP.  Treatment team will focus on ADLs and mobility with goals set at min A  See Team Conference Notes for weekly updates to the plan of care

## 2013-05-26 NOTE — Progress Notes (Signed)
Social Work Patient ID: Stacie [name], female   DOB: [birth year], 72 y.o.   MRN: [id number] Met with pt and granddaughter in-law to inform of team conference goals min/mod level and discharge date 12/12. Aware pt will need 24 hr care at discharge.  Interpreter helping with pt's participation in therapies.  Want 7;30 when MD rounding To answer questions and go through therapies with her. Will also contact granddaughter pt lives with to update and find out what their plan is. granddaugther in-law has offered to provide the care for pt while the rest of the family members work.  Work on discharge plan.

## 2013-05-26 NOTE — Patient Care Conference (Signed)
Inpatient RehabilitationTeam Conference and Plan of Care Update Date: 05/26/2013   Time: 10;30 AM    Patient Name: Stacie [name]      Medical Record Number: [id number]  Date of Birth: 11-27-1940 Sex: Female         Room/Bed: 4W07C/4W07C-01 Payor Info: Payor: MEDICAID POTENTIAL / Plan: MEDICAID POTENTIAL / Product Type: *No Product type* /    Admitting Diagnosis: L CVA  Admit Date/Time:  05/24/2013  1:08 PM Admission Comments: No comment available   Primary Diagnosis:  CVA (cerebral infarction) Principal Problem: CVA (cerebral infarction)  Patient Active Problem List   Diagnosis Date Noted  . CVA (cerebral infarction) 05/24/2013  . Type II or unspecified type diabetes mellitus with neurological manifestations, not stated as uncontrolled(250.60) 05/24/2013  . Dyslipidemia 05/24/2013  . GERD (gastroesophageal reflux disease) 05/24/2013  . Iron deficiency anemia, unspecified 05/24/2013    Expected Discharge Date: Expected Discharge Date: 06/18/13  Team Members Present: Physician leading conference: Dr. Claudette Laws Social Worker Present: Staci Acosta, LCSW;Becky Collier Monica, LCSW Nurse Present: Kennon Portela, RN PT Present: Edman Circle, PT;Emily Marya Amsler, PT OT Present: Rosalio Loud, Felipa Eth, OT SLP Present: Fae Pippin, SLP PPS Coordinator present : Edson Snowball, Chapman Fitch, RN, CRRN     Current Status/Progress Goal Weekly Team Focus  Medical   dense R hemi but no aphasia  maximize functional improvement for home discharge  increase exercise tolerance   Bowel/Bladder   Incont. bowel LBM-05/23/13, I&O cath q 6 hrs required  Managed Bowel and bladder   Timed toileting q 2-3 during the day   Swallow/Nutrition/ Hydration   Dys. 3 and thin liquids via any means; crushed with puree  Least restrictive p.o. intake as determined by patient's preferences  N/A   ADL's   Pt currently needs mod assist UB bathing and max assist UB dressing, total assist  +2 for LB selfcare sit to stand.  No active movement noted in the RUE or hand at this time.  Goals are overall min assist level for transfers and LB selfcare sit to stand.  Supervision for UB dressing, grooming, and self feeding.  selfcare retraining, neuromuscular re-education, pt/family education   Mobility   Pt requires max assist for bed mobility, max to total assist for squat pivot transfers, +2 assist for standing or ambulation.  No active movement noted in RLE, will assess in SL.   Goals are min assist overall for transfers and mod assist for ambulation  dynamic sitting balance, standing balance, pregait, NMR for RUE/LE, pt/family education   Communication   Max to Mod Asssit  Mod Assist  Focus on verbalizing basic wants/needs; focus on comprehension of basic commands   Safety/Cognition/ Behavioral Observations  Max to [employer] Assist  Min Assist  Focus on basic problem solivng; utilizing memory aids   Pain   No c/o pain  <3   Assess for pain q shift    Skin   Bruising on abd-due to Lovenox injections  No skin breakdown  Monitor and assess skin q shift for skin breakdown      *See Care Plan and progress notes for long and short-term goals.  Barriers to Discharge: no insurance, non english speaking    Possible Resolutions to Barriers:  maximize functioninpatient, translator    Discharge Planning/Teaching Needs:  Home with family who are aware pt will require 24 hr care, someone here always      Team Discussion:  UTI- I & O cath.  No active movement in  arm or leg.  Goals min/mod level of assist.  Interpreter here with therapies and helpful also when MD rounding to answer to questions.  Family aware 24 hr care needed  Revisions to Treatment Plan:  None   Continued Need for Acute Rehabilitation Level of Care: The patient requires daily medical management by a physician with specialized training in physical medicine and rehabilitation for the following conditions: Daily direction of a  multidisciplinary physical rehabilitation program to ensure safe treatment while eliciting the highest outcome that is of practical value to the patient.: Yes Daily medical management of patient stability for increased activity during participation in an intensive rehabilitation regime.: Yes Daily analysis of laboratory values and/or radiology reports with any subsequent need for medication adjustment of medical intervention for : Neurological problems  Garvin Ellena, Lemar Livings 05/27/2013, 8:27 AM

## 2013-05-26 NOTE — Progress Notes (Signed)
The skilled treatment note has been reviewed and SLP is in agreement. Secret Kristensen, M.A., CCC-SLP [phone]  

## 2013-05-26 NOTE — Progress Notes (Signed)
Inpatient Diabetes Program Recommendations  AACE/ADA: New Consensus Statement on Inpatient Glycemic Control (2013)  Target Ranges:  Prepandial:   less than 140 mg/dL      Peak postprandial:   less than 180 mg/dL (1-2 hours)      Critically ill patients:  140 - 180 mg/dL   Thank you for switch to novolog meal coverage rather than oral agent while in the hospital only. However, thus far today the 3 units has not been adequate.  However, also the fasting was high to start with. Now that renal status is improved, could restart the metformin to assist with glucose fasting levels. May still need an increase in meal coverage, but if metformin does not control fastings, may need to add lantus 10 units daily or HS.  Inpatient Diabetes Program Recommendations Insulin - Basal: Not sure pt needs lantus at this point. If fasting is trending high, may want to add 10 units lantus. Insulin - Meal Coverage: Please add 3 units meal coverage (rather than Amaryl) per below. Oral Agents: Please do not use Amaryl while here and allow his beta cells to rest. (Use meal coverage instead)  Thank you, Lenor Coffin, RN, CNS, Diabetes Coordinator [phone])

## 2013-05-26 NOTE — Progress Notes (Signed)
Physical Therapy Session Note  Patient Details  Name: Stacie [name] MRN: [id number] Date of Birth: 07-09-1940  Today's Date: 05/26/2013 Time: 1345-1440 Time Calculation (min): 55 min  Short Term Goals: Week 1:  PT Short Term Goal 1 (Week 1): Pt will perform bed mobility at mod assist consistently with HOB flat PT Short Term Goal 2 (Week 1): Pt will perform squat pivot transfer at max assist R or L with safe technique PT Short Term Goal 3 (Week 1): Pt will perform dynamic sitting balance at min assist PT Short Term Goal 4 (Week 1): Pt will perform standing balance with max assist of one person to perform functional activities PT Short Term Goal 5 (Week 1): Pt will ambulate x 20' w/ LRAD with max assist (+2 for chair follow)  Skilled Therapeutic Interventions/Progress Updates:   Pt received lying in bed this afternoon and feeling fatigued, however agreeable to get OOB for therapy session.  Performed supine to R SL at mod assist this afternoon with better initiation of LUE movement and trunk movement.  Continues to require assist at L knee to flex and bring over RLE.  She was then able to better initiate sitting up by using LUE, however her body habitus prevents her from self assisting as much as possible, therefore requires total assist to completely elevate trunk.  Once sitting at EOB, assisted with donning shoes, then placed small block under BLEs to increase WB assist through LEs during transfer.  Also utilized slideboard during transfer to increase ease.  Pt able to better initiate movement this afternoon and demonstrates increased trunk activation when scooting.  Also performed slideboard transfer <> mat table in gym at max assist with slideboard.  Continue to provide assist at RLE to prevent buckle and forward translation and facilitation at hips to increase forward weight shift.  Once sitting on mat, performed several reps of sit <> stand with and without small step under  LEs.  Provided assist at pts R knee with therapist LE and assist around pts waist to encourage forward weight shift and at pts R axilla to facilitate upright posture.  Pt does very well with forward trunk lean, however requires assist at buttocks for increased glute activation in standing.  Pt able to tolerate approx 20 secs at a time before needing to sit.  Returned pt to room in w/c with quick release belt in place and half lap table on RUE for increased support.  [name] present during whole session and call bell left in reach.    Therapy Documentation Precautions:  Precautions Precautions: Fall Precaution Comments: dense R hemiplegia, pain in R wrist (maybe from fall), pain in R knee, expressive aphasia Restrictions Weight Bearing Restrictions: No   Pain:Pt continues to have some pain in wrist, however ensure that arm is protected during therapy and propped up at end of session for comfort.   See FIM for current functional status  Therapy/Group: Individual Therapy  Vista Deck 05/26/2013, 4:43 PM

## 2013-05-27 ENCOUNTER — Inpatient Hospital Stay (HOSPITAL_COMMUNITY): Payer: Self-pay | Admitting: Rehabilitation

## 2013-05-27 ENCOUNTER — Inpatient Hospital Stay (HOSPITAL_COMMUNITY): Payer: Medicaid Other | Admitting: Speech Pathology

## 2013-05-27 ENCOUNTER — Inpatient Hospital Stay (HOSPITAL_COMMUNITY): Payer: Medicaid Other

## 2013-05-27 DIAGNOSIS — G8110 Spastic hemiplegia affecting unspecified side: Secondary | ICD-10-CM

## 2013-05-27 LAB — GLUCOSE, CAPILLARY
Glucose-Capillary: 228 mg/dL — ABNORMAL HIGH (ref 70–99)
Glucose-Capillary: 178 mg/dL — ABNORMAL HIGH (ref 70–99)

## 2013-05-27 MED ORDER — SENNOSIDES-DOCUSATE SODIUM 8.6-50 MG PO TABS
2.0000 | ORAL_TABLET | Freq: Every day | ORAL | Status: DC
  Administered 2013-05-27 – 2013-06-17 (×22): 2 via ORAL
  Filled 2013-05-27 (×19): qty 2

## 2013-05-27 MED ORDER — INSULIN ASPART 100 UNIT/ML ~~LOC~~ SOLN
5.0000 [IU] | Freq: Three times a day (TID) | SUBCUTANEOUS | Status: DC
  Administered 2013-05-27 – 2013-06-02 (×18): 5 [IU] via SUBCUTANEOUS

## 2013-05-27 MED ORDER — BETHANECHOL CHLORIDE 10 MG PO TABS
10.0000 mg | ORAL_TABLET | Freq: Four times a day (QID) | ORAL | Status: DC
  Administered 2013-05-27 – 2013-05-28 (×5): 10 mg via ORAL
  Filled 2013-05-27 (×8): qty 1

## 2013-05-27 MED ORDER — SORBITOL 70 % SOLN
45.0000 mL | Freq: Once | Status: AC
  Administered 2013-05-27: 45 mL via ORAL
  Filled 2013-05-27: qty 60

## 2013-05-27 MED ORDER — GLIMEPIRIDE 2 MG PO TABS
2.0000 mg | ORAL_TABLET | Freq: Every day | ORAL | Status: DC
  Administered 2013-05-27 – 2013-05-31 (×5): 2 mg via ORAL
  Filled 2013-05-27 (×6): qty 1

## 2013-05-27 NOTE — Plan of Care (Signed)
Problem: RH PAIN MANAGEMENT Goal: RH STG PAIN MANAGED AT OR BELOW PT'S PAIN GOAL <3 out of 10  Outcome: Progressing No c/o pain

## 2013-05-27 NOTE — Progress Notes (Signed)
Patient's metformin on hold per pharm due RI. Will initiate amaryl as BS trending upwards.  UCS negative but had overflow incontinence. Will start low dose urecholine. Family reports no BM X one week and wants am bowel progam. Will start Senna S--sorbitol today.

## 2013-05-27 NOTE — Progress Notes (Signed)
Physical Therapy Session Note  Patient Details  Name: Stacie [name] MRN: [id number] Date of Birth: Mar 24, 1941  Today's Date: 05/27/2013 Time: 4098-1191 Time Calculation (min): 56 min  Short Term Goals: Week 1:  PT Short Term Goal 1 (Week 1): Pt will perform bed mobility at mod assist consistently with HOB flat PT Short Term Goal 2 (Week 1): Pt will perform squat pivot transfer at max assist R or L with safe technique PT Short Term Goal 3 (Week 1): Pt will perform dynamic sitting balance at min assist PT Short Term Goal 4 (Week 1): Pt will perform standing balance with max assist of one person to perform functional activities PT Short Term Goal 5 (Week 1): Pt will ambulate x 20' w/ LRAD with max assist (+2 for chair follow)  Skilled Therapeutic Interventions/Progress Updates:   Pt received sitting in w/c in hallway, having just finished SLP session.  Assisted to gym via w/c and performed slideboard transfer L and R w/c <> mat with small block under LEs to improve WB through LEs during transfer.  Pt doing much better with trunk activation when scooting R and L hip towards front of chair.  She is also able to use LUE to self assist across slideboard.  Continues to require max assist with manual facilitation for forward weight shift to clear buttocks and also assist at RLE to prevent forward translation of hips and to increase WB through RLE during transfer.  Also provided min verbal cues for forward weight shift, however note she demonstrates improvement with manual cues.  Once sitting at [facility], performed scooting R and L in order to work on forward weight shift and trunk shortening.  Note some difficulty due to friction of skin on mat, but with min assist was able to perform.  Also performed 3 reps of standing with mod/max assist (granddaughter present to provide HHA for comfort).  Provided manual facilitation for increased forward weight shift and upright posture once standing and  also min assist at R knee to prevent buckle.  Transitioned to gait training in hallway with use of L handrail for UE support at max assist (+2 assist for chair follow).  Requires manual facilitation for weight shift L in order to assist in clearing/advancing RLE and weight shift R for increased WB through RLE with assist at knee to prevent buckle and hyperextension.  Performed 25' x 1 rep.  Assisted pt back to room and performed another slideboard transfer to bed with same assist and cues mentioned above.  Bed alarm set and all needs in reach.   Therapy Documentation Precautions:  Precautions Precautions: Fall Precaution Comments: dense R hemiplegia, pain in R wrist (maybe from fall), pain in R knee, expressive aphasia Restrictions Weight Bearing Restrictions: No   Pain: No pain during this session.  Pain Assessment Pain Assessment: No/denies pain Pain Score: 0-No pain Locomotion : Ambulation Ambulation/Gait Assistance: 1: +2 Total assist (Max assist w/ +2 for chair follow)  See FIM for current functional status  Therapy/Group: Individual Therapy  Vista Deck 05/27/2013, 4:21 PM

## 2013-05-27 NOTE — Progress Notes (Signed)
Inpatient Diabetes Program Recommendations  AACE/ADA: New Consensus Statement on Inpatient Glycemic Control (2013)  Target Ranges:  Prepandial:   less than 140 mg/dL      Peak postprandial:   less than 180 mg/dL (1-2 hours)      Critically ill patients:  140 - 180 mg/dL   Persistent hyperglycemia:  Fasting cbg's are high.  Then the following cbg's are high as well even with correction and meal coverage Pt needs lantus-start at 10 units daily or HS  Inpatient Diabetes Program Recommendations Insulin - Basal: Fasting glucose has been running high in 200's.  Please add 10 units Lantus daily or HS Insulin - Meal Coverage: Noted increase to 5 units tidwc Oral Agents: Please do not use Amaryl while here and allow his beta cells to rest. (Use meal coverage instead)  Thank you, Lenor Coffin, RN, CNS, Diabetes Coordinator [phone])

## 2013-05-27 NOTE — Progress Notes (Signed)
The skilled treatment note has been reviewed and SLP is in agreement. Feliciano Wynter, M.A., CCC-SLP [phone]  

## 2013-05-27 NOTE — Progress Notes (Signed)
Social Work Patient ID: Stacie [name], female   DOB: Nov 03, 1940, 72 y.o.   MRN: [id number] Spoke with melissa-grandaugther via telephone to discuss team conference goals-min level and discharge 12/12.  They will discuss With the family the plans for pt at home and which home she will go to at discharge.  Will work on a discharge plan.

## 2013-05-27 NOTE — Progress Notes (Signed)
Subjective/Complaints: Slept ok, Pro interpreter translating, pt brighter more alert and talkative this am Answered question regarding stroke recovery and rehab process Wrist pain better in wrist hand orthosis  Small vessel infarct with hx DM (which causes SVD), doubt Ranexa since this can cause arrythmia and this wasn't embolic CVA. Review of Systems - Negative except r side weakness Objective: Vital Signs: Blood pressure 138/47, pulse 67, temperature 98.3 F (36.8 C), temperature source Oral, resp. rate 20, weight 82.3 kg (181 lb 7 oz), SpO2 97.00%. No results found. Results for orders placed during the hospital encounter of 05/24/13 (from the past 72 hour(s))  GLUCOSE, CAPILLARY     Status: Abnormal   Collection Time    05/24/13  4:28 PM      Result Value Range   Glucose-Capillary 198 (*) 70 - 99 mg/dL   Comment 1 Notify RN     Comment 2 Orig Pt Id entered as [id number]    GLUCOSE, CAPILLARY     Status: Abnormal   Collection Time    05/24/13  9:35 PM      Result Value Range   Glucose-Capillary 287 (*) 70 - 99 mg/dL   Comment 1 Notify RN    URINALYSIS, ROUTINE W REFLEX MICROSCOPIC     Status: None   Collection Time    05/25/13  6:15 AM      Result Value Range   Color, Urine YELLOW  YELLOW   APPearance CLEAR  CLEAR   Specific Gravity, Urine 1.012  1.005 - 1.030   pH 5.0  5.0 - 8.0   Glucose, UA NEGATIVE  NEGATIVE mg/dL   Hgb urine dipstick NEGATIVE  NEGATIVE   Bilirubin Urine NEGATIVE  NEGATIVE   Ketones, ur NEGATIVE  NEGATIVE mg/dL   Protein, ur NEGATIVE  NEGATIVE mg/dL   Urobilinogen, UA 0.2  0.0 - 1.0 mg/dL   Nitrite NEGATIVE  NEGATIVE   Leukocytes, UA NEGATIVE  NEGATIVE   Comment: MICROSCOPIC NOT DONE ON URINES WITH NEGATIVE PROTEIN, BLOOD, LEUKOCYTES, NITRITE, OR GLUCOSE <1000 mg/dL.  URINE CULTURE     Status: None   Collection Time    05/25/13  6:15 AM      Result Value Range   Specimen Description URINE, CATHETERIZED     Special Requests NONE     Culture  Setup  Time       Value: 05/25/2013 13:58     Performed at [facility] Count       Value: NO GROWTH     Performed at [facility]   Culture       Value: NO GROWTH     Performed at [facility]   Report Status 05/26/2013 FINAL    CBC WITH DIFFERENTIAL     Status: Abnormal   Collection Time    05/25/13  6:58 AM      Result Value Range   WBC 11.8 (*) 4.0 - 10.5 K/uL   RBC 4.56  3.87 - 5.11 MIL/uL   Hemoglobin 10.4 (*) 12.0 - 15.0 g/dL   HCT 44.0 (*) 10.2 - 72.5 %   MCV 70.0 (*) 78.0 - 100.0 fL   MCH 22.8 (*) 26.0 - 34.0 pg   MCHC 32.6  30.0 - 36.0 g/dL   RDW 36.6  44.0 - 34.7 %   Platelets 203  150 - 400 K/uL   Neutrophils Relative % 56  43 - 77 %   Lymphocytes Relative 27  12 - 46 %  Monocytes Relative 13 (*) 3 - 12 %   Eosinophils Relative 4  0 - 5 %   Basophils Relative 0  0 - 1 %   Neutro Abs 6.6  1.7 - 7.7 K/uL   Lymphs Abs 3.2  0.7 - 4.0 K/uL   Monocytes Absolute 1.5 (*) 0.1 - 1.0 K/uL   Eosinophils Absolute 0.5  0.0 - 0.7 K/uL   Basophils Absolute 0.0  0.0 - 0.1 K/uL   Smear Review MORPHOLOGY UNREMARKABLE    COMPREHENSIVE METABOLIC PANEL     Status: Abnormal   Collection Time    05/25/13  6:58 AM      Result Value Range   Sodium 138  135 - 145 mEq/L   Potassium 4.3  3.5 - 5.1 mEq/L   Chloride 100  96 - 112 mEq/L   CO2 24  19 - 32 mEq/L   Glucose, Bld 220 (*) 70 - 99 mg/dL   BUN 50 (*) 6 - 23 mg/dL   Creatinine, Ser 5.28 (*) 0.50 - 1.10 mg/dL   Calcium 9.2  8.4 - 41.3 mg/dL   Total Protein 7.3  6.0 - 8.3 g/dL   Albumin 3.0 (*) 3.5 - 5.2 g/dL   AST 15  0 - 37 U/L   ALT 10  0 - 35 U/L   Alkaline Phosphatase 64  39 - 117 U/L   Total Bilirubin 0.4  0.3 - 1.2 mg/dL   GFR calc non Af Amer 33 (*) >90 mL/min   GFR calc Af Amer 38 (*) >90 mL/min   Comment: (NOTE)     The eGFR has been calculated using the CKD EPI equation.     This calculation has not been validated in all clinical situations.     eGFR's persistently <90 mL/min signify  possible Chronic Kidney     Disease.  VITAMIN B12     Status: None   Collection Time    05/25/13  6:58 AM      Result Value Range   Vitamin B-12 559  211 - 911 pg/mL   Comment: Performed at [facility]  GLUCOSE, CAPILLARY     Status: Abnormal   Collection Time    05/25/13  7:38 AM      Result Value Range   Glucose-Capillary 189 (*) 70 - 99 mg/dL   Comment 1 Notify RN    GLUCOSE, CAPILLARY     Status: Abnormal   Collection Time    05/25/13 11:45 AM      Result Value Range   Glucose-Capillary 297 (*) 70 - 99 mg/dL   Comment 1 Notify RN    GLUCOSE, CAPILLARY     Status: Abnormal   Collection Time    05/25/13  4:35 PM      Result Value Range   Glucose-Capillary 183 (*) 70 - 99 mg/dL   Comment 1 Notify RN    GLUCOSE, CAPILLARY     Status: Abnormal   Collection Time    05/25/13  8:41 PM      Result Value Range   Glucose-Capillary 201 (*) 70 - 99 mg/dL  GLUCOSE, CAPILLARY     Status: Abnormal   Collection Time    05/26/13  7:22 AM      Result Value Range   Glucose-Capillary 199 (*) 70 - 99 mg/dL   Comment 1 Notify RN    BASIC METABOLIC PANEL     Status: Abnormal   Collection Time    05/26/13  8:15 AM  Result Value Range   Sodium 141  135 - 145 mEq/L   Potassium 4.0  3.5 - 5.1 mEq/L   Chloride 106  96 - 112 mEq/L   CO2 25  19 - 32 mEq/L   Glucose, Bld 226 (*) 70 - 99 mg/dL   BUN 25 (*) 6 - 23 mg/dL   Comment: DELTA CHECK NOTED   Creatinine, Ser 0.78  0.50 - 1.10 mg/dL   Comment: DELTA CHECK NOTED   Calcium 9.0  8.4 - 10.5 mg/dL   GFR calc non Af Amer 82 (*) >90 mL/min   GFR calc Af Amer >90  >90 mL/min   Comment: (NOTE)     The eGFR has been calculated using the CKD EPI equation.     This calculation has not been validated in all clinical situations.     eGFR's persistently <90 mL/min signify possible Chronic Kidney     Disease.  GLUCOSE, CAPILLARY     Status: Abnormal   Collection Time    05/26/13 11:36 AM      Result Value Range   Glucose-Capillary  276 (*) 70 - 99 mg/dL   Comment 1 Notify RN    GLUCOSE, CAPILLARY     Status: Abnormal   Collection Time    05/26/13  4:09 PM      Result Value Range   Glucose-Capillary 253 (*) 70 - 99 mg/dL   Comment 1 Notify RN    GLUCOSE, CAPILLARY     Status: Abnormal   Collection Time    05/26/13  8:17 PM      Result Value Range   Glucose-Capillary 339 (*) 70 - 99 mg/dL   Comment 1 Notify RN       HEENT: normal Cardio: RRR and no murmur Resp: CTA B/L and unlabored GI: BS positive and non distended Extremity:  Pulses positive and No Edema Skin:   Intact Neuro: Alert/Oriented, Cranial Nerve Abnormalities R central 7,, Abnormal Sensory reduced sensory on Right side and Abnormal Motor 0/5 in RUE, trace hip ext otherwise 0/5 RLE Musc/Skel:  Other pain with R wrist ROM, no swelling or erythema Gen NAD   Assessment/Plan: 1. 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 Physiatrist is providing close team supervision and 24 hour management of active medical problems listed below. Physiatrist and rehab team continue to assess barriers to discharge/monitor patient progress toward functional and medical goals.  FIM: FIM - Bathing Bathing Steps Patient Completed: Chest;Abdomen;Left upper leg;Right upper leg Bathing: 1: Two helpers  FIM - Upper Body Dressing/Undressing Upper body dressing/undressing steps patient completed: Put head through opening of pull over shirt/dress Upper body dressing/undressing: 2: Max-Patient completed 25-49% of tasks FIM - Lower Body Dressing/Undressing Lower body dressing/undressing: 1: Two helpers  FIM - Toileting Toileting: 1: Two helpers  FIM - Archivist Transfers: 1-Two helpers  FIM - Banker Devices: Arm rests;Sliding board (small step under  BLEs) Bed/Chair Transfer: 2: Bed > Chair or W/C: Max A (lift and lower assist);2: Chair or W/C > Bed: Max A (lift and lower assist);1: Supine > Sit: Total A (helper does all/Pt. < 25%)  FIM - Locomotion: Wheelchair Distance: Pt unable to attempt due to LEs not able to reach floor.  Locomotion: Wheelchair: 1: Total Assistance/staff pushes wheelchair (Pt<25%) FIM - Locomotion: Ambulation Locomotion: Ambulation Assistive Devices:  (L handrail in  hallway) Ambulation/Gait Assistance: 1: +2 Total assist Locomotion: Ambulation: 1: Two helpers  Comprehension Comprehension Mode: Auditory Comprehension: 3-Understands basic 50 - 74% of the time/requires cueing 25 - 50%  of the time  Expression Expression Mode: Verbal Expression: 3-Expresses basic 50 - 74% of the time/requires cueing 25 - 50% of the time. Needs to repeat parts of sentences.  Social Interaction Social Interaction Mode: Asleep Social Interaction: 4-Interacts appropriately 75 - 89% of the time - Needs redirection for appropriate language or to initiate interaction.  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 DVT Prophylaxis/Anticoagulation: Pharmaceutical: Lovenox  2. Pain Management: Will continue prn tramadol for chronic RLE/right knee pain.  3. Mood: difficulty to ascertain with language as well as language barrier. Family to assist with input. LCSW to follow for evaluation. Appeared to be fairly up beat on examination today.  4. Neuropsych: This patient is not capable of making decisions on her own behalf.  5. HTN: Will monitor with bid checks. 6. DM type 2: Continue metformin bid. Monitor BS with ac/hs checks and use SSI for  elevated BS. Titrate as indicated for BS control.  7. H/o angina: Will continue Renexa.  8. Iron deficiency anemia: will add iron supplement. Check Vit B12 levels.  9.  Obstructive uropathy resolved after starting I/O cath will adjust freq to keep vol around    LOS (Days) 3 A FACE TO FACE EVALUATION WAS PERFORMED  KIRSTEINS,ANDREW E 05/27/2013, 8:23 AM

## 2013-05-27 NOTE — Progress Notes (Signed)
Speech Language Pathology Daily Session Note  Patient Details  Name: Stacie [name] MRN: [id number] Date of Birth: 10/09/1940  Today's Date: 05/27/2013 Time: 9562-1308 Time Calculation (min): 56 min  Short Term Goals: Week 1: SLP Short Term Goal 1 (Week 1): Patient will solve basic problems with Min verbal and questioning cues. SLP Short Term Goal 2 (Week 1): Patient will utilize memory aids to recall basic, daily information with Min verbal and questioning cues. SLP Short Term Goal 3 (Week 1): Patient will verbalize basic wants/needs with Mod verbal and questioning cues.  Skilled Therapeutic Interventions: Skilled treatment session focused on cognition and language goals.  SLP facilitated session with skilled observation of breakfast tray of Dys.3 and thin liquids; no overt s/s of aspiration were observed.  Patient required Min verbal and tactile cues to utilize safe swallow strategies, i.e. small bites and sips and slow rate; recommend continued full supervision to ensure utilization of strategies.  SLP also facilitated session with sequencing task of four picture cards; patient required Max verbal and visual cues to sequence cards, self-monitor, and self-correct throughout the task.  Patient required Mod verbal and questioning cues to initiate conversation and answer questions regarding picture cards; processing time and possibly initiation seem to greatly impact patient's communication.  Continue with current plan of care.    FIM:  Comprehension Comprehension Mode: Auditory Comprehension: 3-Understands basic 50 - 74% of the time/requires cueing 25 - 50%  of the time Expression Expression Mode: Verbal Expression: 3-Expresses basic 50 - 74% of the time/requires cueing 25 - 50% of the time. Needs to repeat parts of sentences. Social Interaction Social Interaction: 3-Interacts appropriately 50 - 74% of the time - May be physically or verbally inappropriate. Problem  Solving Problem Solving: 3-Solves basic 50 - 74% of the time/requires cueing 25 - 49% of the time Memory Memory: 3-Recognizes or recalls 50 - 74% of the time/requires cueing 25 - 49% of the time FIM - Eating Eating Activity: 5: Needs verbal cues/supervision;5: Set-up assist for open containers  Pain Pain Assessment Pain Assessment: No/denies pain Pain Score: 0-No pain  Therapy/Group: Individual Therapy  [name] 05/27/2013, 1:27 PM

## 2013-05-27 NOTE — Progress Notes (Signed)
Physical Therapy Session Note  Patient Details  Name: Stacie [name] MRN: [id number] Date of Birth: 10/27/1940  Today's Date: 05/27/2013 Time: 1478-2956 Time Calculation (min): 23 min  Short Term Goals: Week 1:  PT Short Term Goal 1 (Week 1): Pt will perform bed mobility at mod assist consistently with HOB flat PT Short Term Goal 2 (Week 1): Pt will perform squat pivot transfer at max assist R or L with safe technique PT Short Term Goal 3 (Week 1): Pt will perform dynamic sitting balance at min assist PT Short Term Goal 4 (Week 1): Pt will perform standing balance with max assist of one person to perform functional activities PT Short Term Goal 5 (Week 1): Pt will ambulate x 20' w/ LRAD with max assist (+2 for chair follow)  Skilled Therapeutic Interventions/Progress Updates:   Pt received in bed this afternoon with granddaughter in law present for session.  Pt states (per translation) that her stomach hurts and feels like "there is air."  Asked her if she felt as though it were gas or need for bowel movement, and pt replied gas.  Encouraged pt to get OOB to increase motility, however she then groaned that stomach hurt again.  Suggested that she should attempt to use 3in1 beside bed, however had to retreive larger drop arm bedside commode for pt.  Upon PT return, pt had already had bowel incontinence that was very loose.  Nurse tech made aware and assisted.  Pt performed several reps of rolling R and L at mod assist to assist with peri care.  Applied new alyvan patch to buttocks as old one was soiled.  Applied new bedding and brief and placed pt on bed pan to continue with bowel movement.  Nurse tech in room with pt and all needs in reach.    Therapy Documentation Precautions:  Precautions Precautions: Fall Precaution Comments: dense R hemiplegia, pain in R wrist (maybe from fall), pain in R knee, expressive aphasia Restrictions Weight Bearing Restrictions: No    Pain:  Pt with pain in stomach during session.   Had bowel incontinence in room.   See FIM for current functional status  Therapy/Group: Individual Therapy  Vista Deck 05/27/2013, 5:34 PM

## 2013-05-27 NOTE — Progress Notes (Signed)
Occupational Therapy Session Note  Patient Details  Name: Stacie [name] MRN: [id number] Date of Birth: [birth year]  Today's Date: 05/27/2013 Time: 1100-1158 Time Calculation (min): 58 min  Short Term Goals: Week 1:  OT Short Term Goal 1 (Week 1): Pt will perform UB dressing with min assist following hemi techniques. OT Short Term Goal 2 (Week 1): Pt will donn pullover pants with mod assist using AE PRN. OT Short Term Goal 3 (Week 1): Pt will transfer to drop arm commode with max assist. OT Short Term Goal 4 (Week 1): Pt will perform toilet hygiene and clothing management with max assist sit to stand. OT Short Term Goal 5 (Week 1): Pt will tolerate RUE splint wear for increased positioning.  Skilled Therapeutic Interventions/Progress Updates:    Pt resting in bed with granddaughter and interpreter present.  Pt performed squat pivot transfer with tot A to w/c to engage in bathing/dressing tasks w/c level at sink.  Pt exhibited increased ability to perform sit<>stand at sink with mod A and steady A when standing at sink for therapist to bathe buttocks and pull up pants.  Pt required max verbal cues to maintain erect posture at sink.  Pt attempted to pull up pants but required LUE support to maintain standing balance.  Focus on neruro reed, transfers, sit<>stand, standing balance, task initiation, and activity tolerance.  Therapy Documentation Precautions:  Precautions Precautions: Fall Precaution Comments: dense R hemiplegia, pain in R wrist (maybe from fall), pain in R knee, expressive aphasia Restrictions Weight Bearing Restrictions: No Pain: Pain Assessment Pain Assessment: No/denies pain  See FIM for current functional status  Therapy/Group: Individual Therapy  Rich Brave 05/27/2013, 12:00 PM

## 2013-05-27 NOTE — Plan of Care (Signed)
Problem: RH SKIN INTEGRITY Goal: RH STG SKIN FREE OF INFECTION/BREAKDOWN Will remain free of skin breakdown while on rehab unit with moderate assistance  Outcome: Not Progressing Stage 2 on sacrum

## 2013-05-28 ENCOUNTER — Inpatient Hospital Stay (HOSPITAL_COMMUNITY): Payer: Medicaid Other | Admitting: Physical Therapy

## 2013-05-28 ENCOUNTER — Encounter (HOSPITAL_COMMUNITY): Payer: Self-pay | Admitting: Occupational Therapy

## 2013-05-28 ENCOUNTER — Inpatient Hospital Stay (HOSPITAL_COMMUNITY): Payer: Medicaid Other | Admitting: Speech Pathology

## 2013-05-28 ENCOUNTER — Inpatient Hospital Stay (HOSPITAL_COMMUNITY): Payer: Self-pay | Admitting: Occupational Therapy

## 2013-05-28 LAB — GLUCOSE, CAPILLARY
Glucose-Capillary: 282 mg/dL — ABNORMAL HIGH (ref 70–99)
Glucose-Capillary: 251 mg/dL — ABNORMAL HIGH (ref 70–99)
Glucose-Capillary: 244 mg/dL — ABNORMAL HIGH (ref 70–99)
Glucose-Capillary: 157 mg/dL — ABNORMAL HIGH (ref 70–99)

## 2013-05-28 MED ORDER — BETHANECHOL CHLORIDE 25 MG PO TABS
25.0000 mg | ORAL_TABLET | Freq: Four times a day (QID) | ORAL | Status: DC
  Administered 2013-05-28 – 2013-06-17 (×80): 25 mg via ORAL
  Filled 2013-05-28 (×83): qty 1

## 2013-05-28 NOTE — Progress Notes (Signed)
Occupational Therapy Session Note  Patient Details  Name: Stacie [name] MRN: [id number] Date of Birth: 1940/11/23  Today's Date: 05/28/2013 Time: 4098-1191 Time Calculation (min): 29 min  Short Term Goals: Week 1:  OT Short Term Goal 1 (Week 1): Pt will perform UB dressing with min assist following hemi techniques. OT Short Term Goal 2 (Week 1): Pt will donn pullover pants with mod assist using AE PRN. OT Short Term Goal 3 (Week 1): Pt will transfer to drop arm commode with max assist. OT Short Term Goal 4 (Week 1): Pt will perform toilet hygiene and clothing management with max assist sit to stand. OT Short Term Goal 5 (Week 1): Pt will tolerate RUE splint wear for increased positioning.  Skilled Therapeutic Interventions/Progress Updates:    Began with pt in supine position position working on RUE PROM and stretching.  Pt with increased tightness in her digits but able to perform gentle joint mobilizations and restore full PROM.  Transitioned to sitting EOB with max assist and worked on practicing bed to wheelchair and wheelchair to drop arm commode transfers.  Pt overall needing max assist to complete squat pivot transfer to the right and left.  Therapy Documentation Precautions:  Precautions Precautions: Fall Precaution Comments: dense R hemiplegia, pain in R wrist (maybe from fall), pain in R knee, expressive aphasia Restrictions Weight Bearing Restrictions: No  Pain: Pain Assessment Pain Assessment: No/denies pain  See FIM for current functional status  Therapy/Group: Individual Therapy  Tylicia Sherman OTR/L 05/28/2013, 4:01 PM

## 2013-05-28 NOTE — Progress Notes (Signed)
Occupational Therapy Session Note  Patient Details  Name: Stacie [name] MRN: [id number] Date of Birth: [birth year]  Today's Date: 05/28/2013 Time: 1000-1100 Time Calculation (min): 60 min  Short Term Goals: Week 1:  OT Short Term Goal 1 (Week 1): Pt will perform UB dressing with min assist following hemi techniques. OT Short Term Goal 2 (Week 1): Pt will donn pullover pants with mod assist using AE PRN. OT Short Term Goal 3 (Week 1): Pt will transfer to drop arm commode with max assist. OT Short Term Goal 4 (Week 1): Pt will perform toilet hygiene and clothing management with max assist sit to stand. OT Short Term Goal 5 (Week 1): Pt will tolerate RUE splint wear for increased positioning.  Skilled Therapeutic Interventions/Progress Updates:    Pt worked on bathing and dressing sit to stand at the sink with interpreter present.  Pt needed mod instructional cueing to sequence through bathing.  Utilized the RUE as a stabilizer for holding washcloth and holding deodorant as well as with assisting with washing her left arm and right leg.  Overall she continues to need total hand over hand assist to integrate the LUE into functional tasks.  Pt needs max assist for sit to stand and standing balance while attempting to pull her brief and pants over her hips.  When she is attempting to help there is a posterior shift in her posture and she losses her balance backwards.  Therapist is also providing max assist to stabilize the right knee.  Began utilization of a reacher for LB selfcare as pt cannot reach either lower leg for donning clothing.  She needs max instructional cueing to follow hemi-techniques and begin with the right leg or right UE as well.  Pt with very little verbal response to most of therapist's questions.  Therapy Documentation Precautions:  Precautions Precautions: Fall Precaution Comments: dense R hemiplegia, pain in R wrist (maybe from fall), pain in R knee,  expressive aphasia Restrictions Weight Bearing Restrictions: No  Pain: Pain Assessment Pain Assessment: Faces Faces Pain Scale: Hurts a little bit Pain Type: Acute pain Pain Location: Wrist Pain Orientation: Right Pain Intervention(s): Repositioned Multiple Pain Sites: No ADL: See FIM for current functional status  Therapy/Group: Individual Therapy  Lyanne Kates OTR/L 05/28/2013, 12:44 PM

## 2013-05-28 NOTE — Progress Notes (Signed)
Speech Language Pathology Daily Session Note  Patient Details  Name: Nyleah Mcginnis MRN: [id number] Date of Birth: [birth year]  Today's Date: 05/28/2013 Time: 0830-0905 Time Calculation (min): 35 min  Short Term Goals: Week 1: SLP Short Term Goal 1 (Week 1): Patient will solve basic problems with Min verbal and questioning cues. SLP Short Term Goal 2 (Week 1): Patient will utilize memory aids to recall basic, daily information with Min verbal and questioning cues. SLP Short Term Goal 3 (Week 1): Patient will verbalize basic wants/needs with Mod verbal and questioning cues.  Skilled Therapeutic Interventions: Skilled treatment session focused on addressing cognitive-linguistic goals.  Interpreter present for session; upon SLP entering room patient reported needing to get up and go to the bathroom.  Nursing present to assist with toileting.  SLP facilitated session with increased wait time and Mod verbal and tactile cues to initiate, sequence and follow directions during transfers to perform basic self-care tasks.  Continue with current plan of care.   FIM:  Comprehension Comprehension Mode: Auditory Comprehension: 3-Understands basic 50 - 74% of the time/requires cueing 25 - 50%  of the time Expression Expression Mode: Verbal Expression: 3-Expresses basic 50 - 74% of the time/requires cueing 25 - 50% of the time. Needs to repeat parts of sentences. Social Interaction Social Interaction: 3-Interacts appropriately 50 - 74% of the time - May be physically or verbally inappropriate. Problem Solving Problem Solving: 3-Solves basic 50 - 74% of the time/requires cueing 25 - 49% of the time Memory Memory: 3-Recognizes or recalls 50 - 74% of the time/requires cueing 25 - 49% of the time FIM - Eating Eating Activity: 5: Needs verbal cues/supervision;5: Set-up assist for open containers  Pain Pain Assessment Pain Assessment: No/denies pain  Therapy/Group: Individual  Therapy  Charlane Ferretti., CCC-SLP [phone]  Antia Rahal 05/28/2013, 4:46 PM

## 2013-05-28 NOTE — Progress Notes (Signed)
Inpatient Diabetes Program Recommendations  AACE/ADA: New Consensus Statement on Inpatient Glycemic Control (2013)  Target Ranges:  Prepandial:   less than 140 mg/dL      Peak postprandial:   less than 180 mg/dL (1-2 hours)      Critically ill patients:  140 - 180 mg/dL   Results for Stacie [name], Stacie [name] (MRN [id number]) as of 05/28/2013 12:31  Ref. Range 05/27/2013 07:25 05/27/2013 11:46 05/27/2013 17:07 05/27/2013 20:17 05/28/2013 07:27 05/28/2013 12:14  Glucose-Capillary Latest Range: 70-99 mg/dL 409 (H) 811 (H) 914 (H) 229 (H) 282 (H) 251 (H)    Inpatient Diabetes Program Recommendations Insulin - Basal: Please consider low dose basal insulin; recommend Levemir 5 units daily or QHS. Correction (SSI): Please consider increasing Novolog correction to moderate scale.  Note: Blood glucose ranged from 178-269 mg/dl on 78/29 and fasting glucose was 282 mg/dl this morning.  Noted Amaryl 2mg  QAM was started on 11/20 and Novolog meal coverage was increased to 5 units TID with meals on 11/20.  Despite addition of Amaryl and increase in meal coverage, blood glucose has consistently been in 200's mg/dl. Please consider ordering low dose basal insulin; recommend starting with Levemir 5 units daily or QHS and adjust accordingly.  Also, please consider increasing Novolog correction to moderate scale.   Thanks, Orlando Penner, RN, MSN, CCRN Diabetes Coordinator Inpatient Diabetes Program [phone] (Team Pager) [phone] ([facility] office) [phone] [facility] office)

## 2013-05-28 NOTE — Progress Notes (Signed)
Subjective/Complaints: No pain or breathing issues  Review of Systems - no translator today  Objective: Vital Signs: Blood pressure 130/74, pulse 83, temperature 99.7 F (37.6 C), temperature source Oral, resp. rate 18, weight 82.3 kg (181 lb 7 oz), SpO2 97.00%. No results found. Results for orders placed during the hospital encounter of 05/24/13 (from the past 72 hour(s))  GLUCOSE, CAPILLARY     Status: Abnormal   Collection Time    05/25/13 11:45 AM      Result Value Range   Glucose-Capillary 297 (*) 70 - 99 mg/dL   Comment 1 Notify RN    GLUCOSE, CAPILLARY     Status: Abnormal   Collection Time    05/25/13  4:35 PM      Result Value Range   Glucose-Capillary 183 (*) 70 - 99 mg/dL   Comment 1 Notify RN    GLUCOSE, CAPILLARY     Status: Abnormal   Collection Time    05/25/13  8:41 PM      Result Value Range   Glucose-Capillary 201 (*) 70 - 99 mg/dL  GLUCOSE, CAPILLARY     Status: Abnormal   Collection Time    05/26/13  7:22 AM      Result Value Range   Glucose-Capillary 199 (*) 70 - 99 mg/dL   Comment 1 Notify RN    BASIC METABOLIC PANEL     Status: Abnormal   Collection Time    05/26/13  8:15 AM      Result Value Range   Sodium 141  135 - 145 mEq/L   Potassium 4.0  3.5 - 5.1 mEq/L   Chloride 106  96 - 112 mEq/L   CO2 25  19 - 32 mEq/L   Glucose, Bld 226 (*) 70 - 99 mg/dL   BUN 25 (*) 6 - 23 mg/dL   Comment: DELTA CHECK NOTED   Creatinine, Ser 0.78  0.50 - 1.10 mg/dL   Comment: DELTA CHECK NOTED   Calcium 9.0  8.4 - 10.5 mg/dL   GFR calc non Af Amer 82 (*) >90 mL/min   GFR calc Af Amer >90  >90 mL/min   Comment: (NOTE)     The eGFR has been calculated using the CKD EPI equation.     This calculation has not been validated in all clinical situations.     eGFR's persistently <90 mL/min signify possible Chronic Kidney     Disease.  GLUCOSE, CAPILLARY     Status: Abnormal   Collection Time    05/26/13 11:36 AM      Result Value Range   Glucose-Capillary 276 (*)  70 - 99 mg/dL   Comment 1 Notify RN    GLUCOSE, CAPILLARY     Status: Abnormal   Collection Time    05/26/13  4:09 PM      Result Value Range   Glucose-Capillary 253 (*) 70 - 99 mg/dL   Comment 1 Notify RN    GLUCOSE, CAPILLARY     Status: Abnormal   Collection Time    05/26/13  8:17 PM      Result Value Range   Glucose-Capillary 339 (*) 70 - 99 mg/dL   Comment 1 Notify RN    GLUCOSE, CAPILLARY     Status: Abnormal   Collection Time    05/27/13  7:25 AM      Result Value Range   Glucose-Capillary 228 (*) 70 - 99 mg/dL   Comment 1 Notify RN    GLUCOSE, CAPILLARY  Status: Abnormal   Collection Time    05/27/13 11:46 AM      Result Value Range   Glucose-Capillary 178 (*) 70 - 99 mg/dL   Comment 1 Notify RN    GLUCOSE, CAPILLARY     Status: Abnormal   Collection Time    05/27/13  5:07 PM      Result Value Range   Glucose-Capillary 269 (*) 70 - 99 mg/dL   Comment 1 Notify RN    GLUCOSE, CAPILLARY     Status: Abnormal   Collection Time    05/27/13  8:17 PM      Result Value Range   Glucose-Capillary 229 (*) 70 - 99 mg/dL     HEENT: normal Cardio: RRR and no murmur Resp: CTA B/L and unlabored GI: BS positive and non distended Extremity:  Pulses positive and No Edema Skin:   Intact Neuro: Alert/Oriented, Cranial Nerve Abnormalities R central 7,, Abnormal Sensory reduced sensory on Right side and Abnormal Motor 0/5 in RUE, trace hip ext otherwise 0/5 RLE Musc/Skel:  Other pain with R wrist ROM, no swelling or erythema Gen NAD   Assessment/Plan: 1. 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 Physiatrist is providing close team supervision and 24 hour management of active medical problems listed below. Physiatrist and rehab team continue to assess barriers to discharge/monitor patient progress toward functional and  medical goals.  FIM: FIM - Bathing Bathing Steps Patient Completed: Chest;Right Arm;Abdomen;Right upper leg;Left upper leg Bathing: 3: Mod-Patient completes 5-7 11f 10 parts or 50-74%  FIM - Upper Body Dressing/Undressing Upper body dressing/undressing steps patient completed: Put head through opening of pull over shirt/dress Upper body dressing/undressing: 2: Max-Patient completed 25-49% of tasks FIM - Lower Body Dressing/Undressing Lower body dressing/undressing: 1: Total-Patient completed less than 25% of tasks  FIM - Toileting Toileting: 1: Two helpers  FIM - Archivist Transfers: 1-Two helpers  FIM - Banker Devices: Arm rests;Sliding board (small step under BLEs) Bed/Chair Transfer: 2: Chair or W/C > Bed: Max A (lift and lower assist);2: Bed > Chair or W/C: Max A (lift and lower assist)  FIM - Locomotion: Wheelchair Distance: Pt unable to attempt due to LEs not able to reach floor.  Locomotion: Wheelchair: 1: Total Assistance/staff pushes wheelchair (Pt<25%) FIM - Locomotion: Ambulation Locomotion: Ambulation Assistive Devices:  (L handrail) Ambulation/Gait Assistance: 1: +2 Total assist (Max assist w/ +2 for chair follow) Locomotion: Ambulation: 1: Two helpers  Comprehension Comprehension Mode: Auditory Comprehension: 3-Understands basic 50 - 74% of the time/requires cueing 25 - 50%  of the time  Expression Expression Mode: Verbal Expression: 3-Expresses basic 50 - 74% of the time/requires cueing 25 - 50% of the time. Needs to repeat parts of sentences.  Social Interaction Social Interaction Mode: Asleep Social Interaction: 3-Interacts appropriately 50 - 74% of the time - May be physically or verbally inappropriate.  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 DVT Prophylaxis/Anticoagulation: Pharmaceutical: Lovenox  2. Pain Management: Will continue prn tramadol for chronic RLE/right knee pain.  3. Mood:  difficulty to ascertain with language as well as language barrier. Family to assist with input. LCSW to follow for evaluation. Appeared to be fairly up beat on examination today.  4. Neuropsych: This patient is not capable of making decisions on her own behalf.  5. HTN: Will monitor with bid checks. 6. DM type 2: Continue metformin bid. Monitor BS with ac/hs checks and use SSI for elevated BS. Titrate as indicated for BS control.  7. H/o angina: Will continue Ranexa. Negative cath reported this summer 8. Iron deficiency anemia: will add iron supplement. Check Vit B12 levels.  9.  Obstructive uropathy resolved after starting I/O cath will adjust freq to keep vol around    LOS (Days) 4 A FACE TO FACE EVALUATION WAS PERFORMED  KIRSTEINS,ANDREW E 05/28/2013, 8:15 AM

## 2013-05-28 NOTE — Progress Notes (Signed)
Physical Therapy Session Note  Patient Details  Name: Stacie [name] MRN: [id number] Date of Birth: 03-06-1941  Today's Date: 05/28/2013 Time: 1100-1200 Time Calculation (min): 60 min  Short Term Goals: Week 1:  PT Short Term Goal 1 (Week 1): Pt will perform bed mobility at mod assist consistently with HOB flat PT Short Term Goal 2 (Week 1): Pt will perform squat pivot transfer at max assist R or L with safe technique PT Short Term Goal 3 (Week 1): Pt will perform dynamic sitting balance at min assist PT Short Term Goal 4 (Week 1): Pt will perform standing balance with max assist of one person to perform functional activities PT Short Term Goal 5 (Week 1): Pt will ambulate x 20' w/ LRAD with max assist (+2 for chair follow)  Skilled Therapeutic Interventions/Progress Updates:    Pt received from OT in w/c in room with interpretor and granddaughter in law present. Pt performed sit to stand at sink from w/c 4 attempts  with max A to complete teeth brushing, pt able to tolerate approximately 30 seconds x 2 trials of standing and then suddenly sits without warning. Sitting balance and reaching activities in w/c with back supported. Attempted sliding board transfer w/c to mat table pt able to partially complete with max assist and then refused to continue with task per translator due to fatigue. Scoot edge of chair to perform seated balance unsupported with pt requiring frequent rests. Family education on transfer training and progression with granddaughter displaying good understanding of sliding board and squat pivot transfers. Attempted squat pivot transfer w/c to bed x 3 with max A pt did not complete as she kept pulling down on therapist and displayed difficulty following tactile and verbal cues. Pt requested per translator to stay in w/c. Pt left in w/c with quick release safety belt, needs within reach with family and translator.   Therapy Documentation Precautions:   Precautions Precautions: Fall Precaution Comments: dense R hemiplegia, pain in R wrist (maybe from fall), pain in R knee, expressive aphasia Restrictions Weight Bearing Restrictions: No       Pain: No pain/ denies pain                       See FIM for current functional status  Therapy/Group: Individual Therapy  Jackelyn Knife 05/28/2013, 3:42 PM

## 2013-05-29 ENCOUNTER — Inpatient Hospital Stay (HOSPITAL_COMMUNITY): Payer: Medicaid Other | Admitting: Physical Therapy

## 2013-05-29 ENCOUNTER — Inpatient Hospital Stay (HOSPITAL_COMMUNITY): Payer: Medicaid Other | Admitting: Speech Pathology

## 2013-05-29 ENCOUNTER — Encounter (HOSPITAL_COMMUNITY): Payer: Self-pay | Admitting: *Deleted

## 2013-05-29 DIAGNOSIS — I6330 Cerebral infarction due to thrombosis of unspecified cerebral artery: Secondary | ICD-10-CM

## 2013-05-29 DIAGNOSIS — G8110 Spastic hemiplegia affecting unspecified side: Secondary | ICD-10-CM

## 2013-05-29 LAB — URINALYSIS, ROUTINE W REFLEX MICROSCOPIC
Ketones, ur: NEGATIVE mg/dL
Nitrite: POSITIVE — AB
Specific Gravity, Urine: 1.014 (ref 1.005–1.030)
Urobilinogen, UA: 0.2 mg/dL (ref 0.0–1.0)
pH: 8 (ref 5.0–8.0)

## 2013-05-29 LAB — URINE MICROSCOPIC-ADD ON

## 2013-05-29 LAB — GLUCOSE, CAPILLARY
Glucose-Capillary: 117 mg/dL — ABNORMAL HIGH (ref 70–99)
Glucose-Capillary: 282 mg/dL — ABNORMAL HIGH (ref 70–99)

## 2013-05-29 MED ORDER — CIPROFLOXACIN HCL 250 MG PO TABS
250.0000 mg | ORAL_TABLET | Freq: Two times a day (BID) | ORAL | Status: DC
  Administered 2013-05-29 – 2013-05-31 (×5): 250 mg via ORAL
  Filled 2013-05-29 (×8): qty 1

## 2013-05-29 MED ORDER — INSULIN ASPART 100 UNIT/ML ~~LOC~~ SOLN
0.0000 [IU] | Freq: Three times a day (TID) | SUBCUTANEOUS | Status: DC
  Administered 2013-05-29: 8 [IU] via SUBCUTANEOUS
  Administered 2013-05-30 (×2): 5 [IU] via SUBCUTANEOUS
  Administered 2013-05-30: 15 [IU] via SUBCUTANEOUS
  Administered 2013-05-31 (×2): 8 [IU] via SUBCUTANEOUS
  Administered 2013-05-31 – 2013-06-01 (×2): 5 [IU] via SUBCUTANEOUS
  Administered 2013-06-01 (×2): 3 [IU] via SUBCUTANEOUS
  Administered 2013-06-02: 15 [IU] via SUBCUTANEOUS
  Administered 2013-06-02: 3 [IU] via SUBCUTANEOUS
  Administered 2013-06-02: 5 [IU] via SUBCUTANEOUS
  Administered 2013-06-03: 8 [IU] via SUBCUTANEOUS
  Administered 2013-06-03: 11 [IU] via SUBCUTANEOUS
  Administered 2013-06-03: 5 [IU] via SUBCUTANEOUS
  Administered 2013-06-04: 3 [IU] via SUBCUTANEOUS
  Administered 2013-06-04: 5 [IU] via SUBCUTANEOUS
  Administered 2013-06-04: 2 [IU] via SUBCUTANEOUS
  Administered 2013-06-05 – 2013-06-06 (×3): 3 [IU] via SUBCUTANEOUS
  Administered 2013-06-06: 2 [IU] via SUBCUTANEOUS
  Administered 2013-06-06: 3 [IU] via SUBCUTANEOUS
  Administered 2013-06-07: 5 [IU] via SUBCUTANEOUS
  Administered 2013-06-07: 2 [IU] via SUBCUTANEOUS
  Administered 2013-06-08: 3 [IU] via SUBCUTANEOUS
  Administered 2013-06-08: 2 [IU] via SUBCUTANEOUS
  Administered 2013-06-08: 3 [IU] via SUBCUTANEOUS
  Administered 2013-06-09: 2 [IU] via SUBCUTANEOUS
  Administered 2013-06-09: 5 [IU] via SUBCUTANEOUS
  Administered 2013-06-10: 3 [IU] via SUBCUTANEOUS
  Administered 2013-06-10 – 2013-06-11 (×2): 2 [IU] via SUBCUTANEOUS
  Administered 2013-06-11: 3 [IU] via SUBCUTANEOUS
  Administered 2013-06-12: 2 [IU] via SUBCUTANEOUS
  Administered 2013-06-12: 3 [IU] via SUBCUTANEOUS
  Administered 2013-06-12: 2 [IU] via SUBCUTANEOUS
  Administered 2013-06-13 (×2): 3 [IU] via SUBCUTANEOUS
  Administered 2013-06-14: 2 [IU] via SUBCUTANEOUS
  Administered 2013-06-14 – 2013-06-15 (×4): 3 [IU] via SUBCUTANEOUS
  Administered 2013-06-16: 2 [IU] via SUBCUTANEOUS
  Administered 2013-06-17 (×2): 3 [IU] via SUBCUTANEOUS

## 2013-05-29 NOTE — Progress Notes (Signed)
Occupational Therapy Note  Patient Details  Name: Stacie [name] MRN: [id number] Date of Birth: 05/23/1941 Today's Date: 05/29/2013  Time:0900-1000  (60 min) Pain:none Individual session   Engaged in transfers to toilet, shower seat.  Utilized wc to go to toilet  With total assist to toilet and then to shower.  Pt. Needed assist bathing LUE and BLE.  Pt, stood with total assit  To wash posterior peri care.  Daughter, Sela Hilding, assisted with care.  Transferred to wc with total assist and dressed at sink.  Left with family with safety belt on.  Humberto Seals 05/29/2013, 9:59 AM

## 2013-05-29 NOTE — Progress Notes (Signed)
Physical Therapy Session Note  Patient Details  Name: Stacie [name] MRN: [id number] Date of Birth: Jun 02, 1941  Today's Date: 05/29/2013 Time: 1300-1330 Time Calculation (min): 30 min  Short Term Goals: Week 1:  PT Short Term Goal 1 (Week 1): Pt will perform bed mobility at mod assist consistently with HOB flat PT Short Term Goal 2 (Week 1): Pt will perform squat pivot transfer at max assist R or L with safe technique PT Short Term Goal 3 (Week 1): Pt will perform dynamic sitting balance at min assist PT Short Term Goal 4 (Week 1): Pt will perform standing balance with max assist of one person to perform functional activities PT Short Term Goal 5 (Week 1): Pt will ambulate x 20' w/ LRAD with max assist (+2 for chair follow)  Therapy Documentation Precautions:  Precautions: Fall Precaution Comments: dense R hemiplegia, pain in R wrist (maybe from fall), pain in R knee, expressive aphasia Restrictions Weight Bearing Restrictions: No Pain: Pain Assessment Pain Assessment: No/denies pain  Therapeutic Exercise:(15') R and L LE stretching, ROM and manually resisted L LE exercises in supine and AAROM R LE in supine. Therapeutic Activity:(15') bed mobility to roll on L side with Max-assist, L sidelying to sit at EOB with Max-assist. Transfer using wood block/step due to short height of patient and performed stand-pivot transfer Max/Total-assist to recliner chair. Sitting balance at EOB x 5 minutes with good midline orientation and only L UE on bed for support.    Therapy/Group: Individual Therapy  Porschia Willbanks J 05/29/2013, 1:01 PM

## 2013-05-29 NOTE — Progress Notes (Signed)
Pt incontinent of extremely malodorous urine several times through the night.  At 0500 pt c/o lower mid abd pain after incontinence and bladder scan for 20 cc urine post void. Daughter states pt rates pain 10/10. Intermittent and crampy.  Given ultram 50 mg po with no relief.  I and O cath for malodorous, cloudy, tea colored urine with mucous, sediment and bloody streaks.  Urine sent to lab for UA and culture.  Pt denies fever, chills, nausea, vomiting. Abd soft with active bowel sounds.

## 2013-05-29 NOTE — Progress Notes (Signed)
Physical Therapy Session Note  Patient Details  Name: Stacie [name] MRN: [id number] Date of Birth: [birth year]  Today's Date: 05/29/2013 Time: 0830-0900 Time Calculation (min): 30 min  Short Term Goals: Week 1:  PT Short Term Goal 1 (Week 1): Pt will perform bed mobility at mod assist consistently with HOB flat PT Short Term Goal 2 (Week 1): Pt will perform squat pivot transfer at max assist R or L with safe technique PT Short Term Goal 3 (Week 1): Pt will perform dynamic sitting balance at min assist PT Short Term Goal 4 (Week 1): Pt will perform standing balance with max assist of one person to perform functional activities PT Short Term Goal 5 (Week 1): Pt will ambulate x 20' w/ LRAD with max assist (+2 for chair follow)  Therapy Documentation Precautions:  Precautions: Fall Precaution Comments: dense R hemiplegia, pain in R wrist (maybe from fall), pain in R knee, expressive aphasia Restrictions Weight Bearing Restrictions: No Pain: no c/o pain  Therapeutic Activity:(30') Patient resting in bed with family members present to assist with translation.  Bed mobility rolling R with min-assist and patient using  Bedrail, rolling Left with Mod-assist. Transfers supine into sitting via [place] right with Max-assist, scooting to EOB with Max-assist. 4" stepstool/block step used for transfers bed<->w/c multiple time including stand-pivot transfer w/ Max-assist, squat-pivot transfer with Max-assist, and slideboard transfers with min-assist. Patient demonstrated good sitting balance, both static and dynamic.    Therapy/Group: Individual Therapy  Takela Varden J 05/29/2013, 9:01 AM

## 2013-05-29 NOTE — Progress Notes (Signed)
Subjective/Complaints: No specific complaints this morning. Less likely she had painful urination. Urinalysis was sent for evaluation.  Review of systems: Family members are here for translation. Patient denies any significant complaints.  Objective: Vital Signs: Blood pressure 126/74, pulse 78, temperature 98.6 F (37 C), temperature source Oral, resp. rate 18, weight 181 lb 7 oz (82.3 kg), SpO2 93.00%.  CBG (last 3)   Recent Labs  05/28/13 1638 05/28/13 2028 05/29/13 0748  GLUCAP 244* 157* 244*    No acute distress. Chest clear to auscultation. Cardiac exam S1-S2 are regular. Extremities no edema. Abdominal exam overweight, active bowel sounds. Neurologic exam she is alert.  Assessment/Plan: 1. 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 DVT Prophylaxis/Anticoagulation: Pharmaceutical: Lovenox  2. Pain Management: Will continue prn tramadol for chronic RLE/right knee pain.  3. Mood: difficulty to ascertain with language as well as language barrier. Family to assist with input. LCSW to follow for evaluation. Appeared to be fairly up beat on examination today.  4. Neuropsych: This patient is not capable of making decisions on her own behalf.  5. HTN: Will monitor with bid checks. 6. DM type 2: Continue metformin bid. Monitor BS with ac/hs checks and use SSI for elevated BS. Increase sliding scale insulin. 7. H/o angina: Will continue Ranexa. Negative cath reported this summer 8. Iron deficiency anemia: will add iron supplement. Check Vit B12 levels.  9.  Obstructive uropathy resolved after starting I/O cath will adjust freq to keep vol around 10. UTI. Patient has no allergies. Will start empiric ciprofloxacin.     LOS (Days) 5 A FACE TO FACE EVALUATION WAS PERFORMED  SWORDS,BRUCE HENRY 05/29/2013, 9:46 AM

## 2013-05-29 NOTE — Progress Notes (Signed)
Physical Therapy Session Note  Patient Details  Name: Stacie [name] MRN: [id number] Date of Birth: 1940/08/30  Today's Date: 05/29/2013 Time: 1630-1700 Time Calculation (min): 30 min  Short Term Goals: Week 1:  PT Short Term Goal 1 (Week 1): Pt will perform bed mobility at mod assist consistently with HOB flat PT Short Term Goal 2 (Week 1): Pt will perform squat pivot transfer at max assist R or L with safe technique PT Short Term Goal 3 (Week 1): Pt will perform dynamic sitting balance at min assist PT Short Term Goal 4 (Week 1): Pt will perform standing balance with max assist of one person to perform functional activities PT Short Term Goal 5 (Week 1): Pt will ambulate x 20' w/ LRAD with max assist (+2 for chair follow)  Therapy Documentation Precautions:  Precautions: Fall Precaution Comments: dense R hemiplegia, pain in R wrist (maybe from fall), pain in R knee, expressive aphasia Restrictions Weight Bearing Restrictions: No Pain: patient notes mild pain at L upper trapezius/neck but declines need for medications           Patient asleep upon entering room and despite being very fatigued and tired was willing to participate with PT. Therapeutic Activity:(15') Bed mobility rolling, scooting to HOB all with Max-assist Therapeutic Exercise:(15') supine in bed including AA/PROM R LE and bridging. Patient able to count exercise repetitions aloud (in Spanish)           Patient repositioned in bed for dinner time.  Therapy/Group: Individual Therapy  Rex Kras 05/29/2013, 5:22 PM

## 2013-05-29 NOTE — Progress Notes (Signed)
Speech Language Pathology Daily Session Note  Patient Details  Name: Stacie [name] MRN: [id number] Date of Birth: 1941/02/14  Today's Date: 05/29/2013 Time: 1100-1130 Time Calculation (min): 30 min  Short Term Goals: Week 1: SLP Short Term Goal 1 (Week 1): Patient will solve basic problems with Min verbal and questioning cues. SLP Short Term Goal 2 (Week 1): Patient will utilize memory aids to recall basic, daily information with Min verbal and questioning cues. SLP Short Term Goal 3 (Week 1): Patient will verbalize basic wants/needs with Mod verbal and questioning cues.  Skilled Therapeutic Interventions: Skilled ST intervention provided with focus on cognitive goals. Pt seen in room for tx, with family member present as interpreter. Pt noted with decreased problem solving skills. Pt sequenced 4-step picture cards with 40% accuracy, given min-mod assist. Slp administered money management activity. Pt selected given amounts of money, using only bills, with 80% accuracy, independent. Pt calculated appropriate change, given small amounts with 90% accuracy.    FIM:  Comprehension Comprehension Mode: Auditory Comprehension: 3-Understands basic 50 - 74% of the time/requires cueing 25 - 50%  of the time Expression Expression Mode: Verbal Expression: 3-Expresses basic 50 - 74% of the time/requires cueing 25 - 50% of the time. Needs to repeat parts of sentences. Social Interaction Social Interaction: 4-Interacts appropriately 75 - 89% of the time - Needs redirection for appropriate language or to initiate interaction. Problem Solving Problem Solving: 3-Solves basic 50 - 74% of the time/requires cueing 25 - 49% of the time Memory Memory: 3-Recognizes or recalls 50 - 74% of the time/requires cueing 25 - 49% of the time FIM - Eating Eating Activity: 5: Needs verbal cues/supervision  Pain Pain Assessment Pain Assessment: No/denies pain Pain Score: 10-Worst pain ever Pain  Type: Acute pain Pain Location: Abdomen Pain Orientation: Mid;Lower Patients Stated Pain Goal: 2 Pain Intervention(s): Medication (See eMAR)  Therapy/Group: Individual Therapy  Wei Newbrough, Kara Pacer 05/29/2013, 3:56 PM

## 2013-05-30 LAB — GLUCOSE, CAPILLARY: Glucose-Capillary: 220 mg/dL — ABNORMAL HIGH (ref 70–99)

## 2013-05-30 NOTE — Progress Notes (Signed)
Subjective/Complaints: Family memebers present for interpretation UTI-pt has no sxs Has posterior neck pain when she moves her neck.  Objective: Vital Signs: Blood pressure 124/51, pulse 64, temperature 97.9 F (36.6 C), temperature source Oral, resp. rate 18, weight 181 lb 7 oz (82.3 kg), SpO2 96.00%.  CBG (last 3)   Recent Labs  05/29/13 1645 05/29/13 2036 05/30/13 0732  GLUCAP 117* 282* 220*    No acute distress. Chest clear to auscultation. Cardiac exam S1-S2 are regular. Extremities no edema. Abdominal exam overweight, active bowel sounds. Neurologic exam she is alert.  Assessment/Plan: 1. 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 DVT Prophylaxis/Anticoagulation: Pharmaceutical: Lovenox  2. Pain Management: Will continue prn tramadol for chronic RLE/right knee pain.  Will add kpad for neck pain 3. Mood: hard to distinguish today-- in pain 4. Neuropsych: This patient is not capable of making decisions on her own behalf.  5. HTN: Will monitor with bid checks. controlled 6. DM type 2: Continue metformin bid. Monitor BS with ac/hs checks and use SSI for elevated BS. CBG (last 3)   Recent Labs  05/29/13 1645 05/29/13 2036 05/30/13 0732  GLUCAP 117* 282* 220*   Increased SSI on 11/22. Will monitro today  7. H/o angina: Will continue Ranexa. Negative cath reported this summer 8. Iron deficiency anemia: will add iron supplement. Check Vit B12 levels.  9.  Obstructive uropathy resolved after starting I/O cath will adjust freq to keep vol around 10. UTI. Patient has no allergies. Will start empiric ciprofloxacin.  (prelim: > 100k colonies)    LOS (Days) 6 A FACE TO FACE EVALUATION WAS PERFORMED  SWORDS,BRUCE HENRY 05/30/2013, 9:58 AM

## 2013-05-31 ENCOUNTER — Encounter (HOSPITAL_COMMUNITY): Payer: Self-pay | Admitting: Occupational Therapy

## 2013-05-31 ENCOUNTER — Inpatient Hospital Stay (HOSPITAL_COMMUNITY): Payer: Self-pay | Admitting: Rehabilitation

## 2013-05-31 ENCOUNTER — Inpatient Hospital Stay (HOSPITAL_COMMUNITY): Payer: Medicaid Other | Admitting: Speech Pathology

## 2013-05-31 ENCOUNTER — Inpatient Hospital Stay (HOSPITAL_COMMUNITY): Payer: Self-pay | Admitting: Occupational Therapy

## 2013-05-31 LAB — URINE CULTURE: Colony Count: >=100000

## 2013-05-31 LAB — BASIC METABOLIC PANEL
CO2: 27 mEq/L (ref 19–32)
Calcium: 8.9 mg/dL (ref 8.4–10.5)
Chloride: 101 mEq/L (ref 96–112)
GFR calc Af Amer: 70 mL/min — ABNORMAL LOW (ref >90)
Sodium: 136 mEq/L (ref 135–145)

## 2013-05-31 LAB — GLUCOSE, CAPILLARY
Glucose-Capillary: 245 mg/dL — ABNORMAL HIGH (ref 70–99)
Glucose-Capillary: 276 mg/dL — ABNORMAL HIGH (ref 70–99)

## 2013-05-31 LAB — CREATININE, SERUM: Creatinine, Ser: 0.69 mg/dL (ref 0.50–1.10)

## 2013-05-31 MED ORDER — TIZANIDINE HCL 2 MG PO TABS
2.0000 mg | ORAL_TABLET | Freq: Three times a day (TID) | ORAL | Status: DC
  Administered 2013-05-31 – 2013-06-17 (×53): 2 mg via ORAL
  Filled 2013-05-31 (×55): qty 1

## 2013-05-31 MED ORDER — BD GETTING STARTED TAKE HOME KIT: 1/2ML X 30G SYRINGES
1.0000 | Freq: Once | Status: DC
  Filled 2013-05-31: qty 1

## 2013-05-31 MED ORDER — CEPHALEXIN 250 MG PO CAPS
250.0000 mg | ORAL_CAPSULE | Freq: Three times a day (TID) | ORAL | Status: AC
  Administered 2013-05-31 – 2013-06-07 (×21): 250 mg via ORAL
  Filled 2013-05-31 (×21): qty 1

## 2013-05-31 MED ORDER — CEPHALEXIN 250 MG PO CAPS
250.0000 mg | ORAL_CAPSULE | Freq: Three times a day (TID) | ORAL | Status: DC
  Filled 2013-05-31 (×3): qty 1

## 2013-05-31 MED ORDER — GLIMEPIRIDE 4 MG PO TABS
4.0000 mg | ORAL_TABLET | Freq: Every day | ORAL | Status: DC
  Administered 2013-06-01 – 2013-06-17 (×17): 4 mg via ORAL
  Filled 2013-05-31 (×18): qty 1

## 2013-05-31 MED ORDER — "BD GETTING STARTED TAKE HOME KIT: 1ML X 30 G SYRINGES, "
1.0000 | Freq: Once | Status: DC
  Filled 2013-05-31: qty 1

## 2013-05-31 NOTE — Progress Notes (Signed)
Inpatient Diabetes Program Recommendations  AACE/ADA: New Consensus Statement on Inpatient Glycemic Control (2013)  Target Ranges:  Prepandial:   less than 140 mg/dL      Peak postprandial:   less than 180 mg/dL (1-2 hours)      Critically ill patients:  140 - 180 mg/dL     Results for Stacie [name], Stacie [name] (MRN [id number]) as of 05/31/2013 12:07  Ref. Range 05/30/2013 07:32 05/30/2013 12:23 05/30/2013 17:02 05/30/2013 20:48  Glucose-Capillary Latest Range: 70-99 mg/dL 409 (H) 811 (H) 914 (H) 271 (H)    Results for Stacie [name], Stacie [name] (MRN [id number]) as of 05/31/2013 12:07  Ref. Range 05/31/2013 07:31 05/31/2013 11:36  Glucose-Capillary Latest Range: 70-99 mg/dL 086 (H) 578 (H)    **No A1c on file.  Patient having elevated CBGs here in hospital.  **MD- Please consider the following in-hospital insulin adjustments:  1. Add basal insulin- Levemir 16 units QHS (0.2 units/kg dosing) 2. Check current A1c- No A1c on file prior to admission 3. Will patient require insulin at d/c?  If so, please have RN begin insulin teaching with patient.  Thanks!   Will follow. Ambrose Finland RN, MSN, CDE Diabetes Coordinator Inpatient Diabetes Program Team Pager: [phone] (8a-10p)

## 2013-05-31 NOTE — Progress Notes (Signed)
Social Work Patient ID: Stacie [name], female   DOB: [birth year], 72 y.o.   MRN: [id number] Pt will require a lightweight wheelchair due to she is not able to self propel a standard weight wheelchair and it needs to be low enough so her feet can touch the floor To be able to self propel.  Pt is of short stature and uses her good leg to maintain her independence.  Wheelchair referral made to [facility] for them to order the wheelchair.

## 2013-05-31 NOTE — Progress Notes (Signed)
Physical Therapy Session Note  Patient Details  Name: Stacie [name] MRN: [id number] Date of Birth: 1941-02-10  Today's Date: 05/31/2013 Time: 1100-1203 Time Calculation (min): 63 min  Short Term Goals: Week 1:  PT Short Term Goal 1 (Week 1): Pt will perform bed mobility at mod assist consistently with HOB flat PT Short Term Goal 2 (Week 1): Pt will perform squat pivot transfer at max assist R or L with safe technique PT Short Term Goal 3 (Week 1): Pt will perform dynamic sitting balance at min assist PT Short Term Goal 4 (Week 1): Pt will perform standing balance with max assist of one person to perform functional activities PT Short Term Goal 5 (Week 1): Pt will ambulate x 20' w/ LRAD with max assist (+2 for chair follow)  Skilled Therapeutic Interventions/Progress Updates:   Pt received sitting in w/c in room, having just finished with OT.  Note she seems to have increased fatigue during this session and requires max verbal encouragement and motivation from therapist to participate.  Assisted pt to gym in order perform supine NMR on mat surface.  Performed slideboard transfer x 2 reps at max assist with small block under LEs for increased leverage to WB through LEs.  Requires max assist, esp when transferring to the R and also facilitation for increased forward weight shift to improve buttock clearance.  Again, note she needs increased verbal and manual cues today to complete task.  See details below for NMR tasks on mat.  Progressed to gait training in hallway with use of L handrail.  See details below.  Attempted standing exercises stepping forwards and backwards with LLE, however she refused to do this activity and instead performed 5 reps of sit <> stand at min/guard assist for improved technique and WB through RLE.  Pt assisted back to room and left in w/c with quick release belt donned and half lap tray at RUE.  Granddaughter present during session.    Therapy  Documentation Precautions:  Precautions Precautions: Fall Precaution Comments: dense R hemiplegia, pain in R wrist (maybe from fall), pain in R knee, expressive aphasia Restrictions Weight Bearing Restrictions: No   Vital Signs: Therapy Vitals Temp: 98 F (36.7 C) Temp src: Oral Pulse Rate: 70 Resp: 18 BP: 111/41 mmHg Patient Position, if appropriate: Lying Oxygen Therapy SpO2: 98 % O2 Device: None (Room air) Pain: Pain Assessment Pain Assessment: Faces Faces Pain Scale: Hurts even more Pain Type: Acute pain Pain Location: Neck Pain Orientation: Lower Pain Descriptors / Indicators: Aching (Pt reported pain due to position during sleep) Pain Onset: On-going Pain Intervention(s): RN made aware Multiple Pain Sites: No   Locomotion : Ambulation Ambulation: Yes Ambulation/Gait Assistance: 1: +2 Total assist Ambulation Distance (Feet): 25 Feet Assistive device:  (L handrail) Ambulation/Gait Assistance Details: Verbal cues for sequencing;Verbal cues for technique;Verbal cues for precautions/safety;Verbal cues for gait pattern;Manual facilitation for weight shifting;Manual facilitation for placement;Manual facilitation for weight bearing;Tactile cues for initiation;Tactile cues for posture Ambulation/Gait Assistance Details: Continue to perform gait training in hallway with use of L handrail for UE support and to determine carryover from NMR on mat surface.  Requires facilitation for weight shift L in order to assist with clearing RLE and assist for weight shift to the R for increased WB and assist at R knee to prevent buckle/hyperextension during stance phase.  Note pt is now able to stand at min/guard assist.   Gait Gait: Yes Gait Pattern: Impaired Gait Pattern: Decreased stance time -  right;Decreased hip/knee flexion - right;Decreased dorsiflexion - right;Decreased weight shift to right;Trunk flexed;Narrow base of support;Right flexed knee in stance    Other Treatments:  Treatments Therapeutic Activity: Performed supine NMR on mat this morning with BLE bridging x 10 reps, RLE only bridging x 10 reps (provided manual faciliation for hip rise at distal femur), hooklying hip abd/add x 10 reps with assist for RLE, attempted R SAQs x 10 reps with tapping at quad for increased activation, however did not note any active movement at this time.  Also performed R hip/knee flex in small physioball, however requires total assist for exercises as pt kept stating "you do it" rather than attempting on her own.  Neuromuscular Facilitation: Right;Lower Extremity;Activity to increase motor control;Activity to increase coordination;Activity to increase sustained activation  See FIM for current functional status  Therapy/Group: Individual Therapy  Vista Deck 05/31/2013, 4:41 PM

## 2013-05-31 NOTE — Progress Notes (Signed)
Speech Language Pathology Daily Session Note  Patient Details  Name: Stacie [name] MRN: [id number] Date of Birth: March 07, 1941  Today's Date: 05/31/2013 Time: 4098-1191 Time Calculation (min): 44 min  Short Term Goals: Week 1: SLP Short Term Goal 1 (Week 1): Patient will solve basic problems with Min verbal and questioning cues. SLP Short Term Goal 2 (Week 1): Patient will utilize memory aids to recall basic, daily information with Min verbal and questioning cues. SLP Short Term Goal 3 (Week 1): Patient will verbalize basic wants/needs with Mod verbal and questioning cues.  Skilled Therapeutic Interventions: Skilled treatment session focused on cognition and language goals.  SLP facilitated session with skilled observation of breakfast tray of Dys.3 and thin liquids; no overt s/s of aspiration were observed, however patient required Min verbal cues to eat at a slow pace.  SLP also facilitated session with picture cards depicting problems that may arise at home; patient required Mod verbal cues to verbalize the problem and answer abstract questions regarding the picture.  Patient demonstrates decreased mental flexibility and comprehension of abstract questions and ideas.  Patient still repeats questions and demonstrates decreased verbal initiation for answering basic and complex questions.  That being said, patient verbalized basic wants/needs with Min question and verbal cues from the SLP.  Family questions if patient's comprehension is impacted by SLP's questioning and types of questions being asked compared to true comprehension deficits.  Continue with current plan of care.   FIM:  Comprehension Comprehension Mode: Auditory Comprehension: 3-Understands basic 50 - 74% of the time/requires cueing 25 - 50%  of the time Expression Expression Mode: Verbal Expression: 4-Expresses basic 75 - 89% of the time/requires cueing 10 - 24% of the time. Needs helper to occlude  trach/needs to repeat words. Social Interaction Social Interaction: 4-Interacts appropriately 75 - 89% of the time - Needs redirection for appropriate language or to initiate interaction. Problem Solving Problem Solving: 3-Solves basic 50 - 74% of the time/requires cueing 25 - 49% of the time Memory Memory: 3-Recognizes or recalls 50 - 74% of the time/requires cueing 25 - 49% of the time FIM - Eating Eating Activity: 5: Needs verbal cues/supervision  Pain Pain Assessment Pain Assessment: Faces Faces Pain Scale: Hurts even more Pain Type: Acute pain Pain Location: Neck Pain Orientation: Lower Pain Descriptors / Indicators: Aching (Pt reported pain due to position during sleep) Pain Onset: On-going Pain Intervention(s): RN made aware Multiple Pain Sites: No  Therapy/Group: Individual Therapy  [name] 05/31/2013, 2:29 PM

## 2013-05-31 NOTE — Progress Notes (Signed)
The skilled treatment note has been reviewed and SLP is in agreement. Mercedes Fort, M.A., CCC-SLP [phone]  

## 2013-05-31 NOTE — Progress Notes (Addendum)
Subjective/Complaints: No pain or breathing issues  Review of Systems - no translator today  Objective: Vital Signs: Blood pressure 138/52, pulse 68, temperature 97.3 F (36.3 C), temperature source Oral, resp. rate 18, weight 82.3 kg (181 lb 7 oz), SpO2 98.00%. No results found. Results for orders placed during the hospital encounter of 05/24/13 (from the past 72 hour(s))  GLUCOSE, CAPILLARY     Status: Abnormal   Collection Time    05/28/13 12:14 PM      Result Value Range   Glucose-Capillary 251 (*) 70 - 99 mg/dL  GLUCOSE, CAPILLARY     Status: Abnormal   Collection Time    05/28/13  4:38 PM      Result Value Range   Glucose-Capillary 244 (*) 70 - 99 mg/dL  GLUCOSE, CAPILLARY     Status: Abnormal   Collection Time    05/28/13  8:28 PM      Result Value Range   Glucose-Capillary 157 (*) 70 - 99 mg/dL   Comment 1 Notify RN    URINE CULTURE     Status: None   Collection Time    05/29/13  6:19 AM      Result Value Range   Specimen Description URINE, CATHETERIZED     Special Requests NONE     Culture  Setup Time       Value: 05/29/2013 14:28     Performed at [facility] Count       Value: >=100,000 COLONIES/ML     Performed at [facility]   Culture       Value: PROTEUS MIRABILIS     Performed at [facility]   Report Status 05/31/2013 FINAL     Organism ID, Bacteria PROTEUS MIRABILIS    URINALYSIS, ROUTINE W REFLEX MICROSCOPIC     Status: Abnormal   Collection Time    05/29/13  6:20 AM      Result Value Range   Color, Urine YELLOW  YELLOW   APPearance TURBID (*) CLEAR   Specific Gravity, Urine 1.014  1.005 - 1.030   pH 8.0  5.0 - 8.0   Glucose, UA NEGATIVE  NEGATIVE mg/dL   Hgb urine dipstick LARGE (*) NEGATIVE   Bilirubin Urine NEGATIVE  NEGATIVE   Ketones, ur NEGATIVE  NEGATIVE mg/dL   Protein, ur >098 (*) NEGATIVE mg/dL   Urobilinogen, UA 0.2  0.0 - 1.0 mg/dL   Nitrite POSITIVE (*) NEGATIVE   Leukocytes, UA MODERATE (*)  NEGATIVE  URINE MICROSCOPIC-ADD ON     Status: Abnormal   Collection Time    05/29/13  6:20 AM      Result Value Range   WBC, UA 11-20  <3 WBC/hpf   RBC / HPF 11-20  <3 RBC/hpf   Bacteria, UA MANY (*) RARE  GLUCOSE, CAPILLARY     Status: Abnormal   Collection Time    05/29/13  7:48 AM      Result Value Range   Glucose-Capillary 244 (*) 70 - 99 mg/dL  GLUCOSE, CAPILLARY     Status: Abnormal   Collection Time    05/29/13 12:42 PM      Result Value Range   Glucose-Capillary 290 (*) 70 - 99 mg/dL  GLUCOSE, CAPILLARY     Status: Abnormal   Collection Time    05/29/13  1:07 PM      Result Value Range   Glucose-Capillary 259 (*) 70 - 99 mg/dL  GLUCOSE, CAPILLARY  Status: Abnormal   Collection Time    05/29/13  4:45 PM      Result Value Range   Glucose-Capillary 117 (*) 70 - 99 mg/dL  GLUCOSE, CAPILLARY     Status: Abnormal   Collection Time    05/29/13  8:36 PM      Result Value Range   Glucose-Capillary 282 (*) 70 - 99 mg/dL  GLUCOSE, CAPILLARY     Status: Abnormal   Collection Time    05/30/13  7:32 AM      Result Value Range   Glucose-Capillary 220 (*) 70 - 99 mg/dL  GLUCOSE, CAPILLARY     Status: Abnormal   Collection Time    05/30/13 12:23 PM      Result Value Range   Glucose-Capillary 345 (*) 70 - 99 mg/dL   Comment 1 Documented in Chart    GLUCOSE, CAPILLARY     Status: Abnormal   Collection Time    05/30/13  5:02 PM      Result Value Range   Glucose-Capillary 240 (*) 70 - 99 mg/dL  GLUCOSE, CAPILLARY     Status: Abnormal   Collection Time    05/30/13  8:48 PM      Result Value Range   Glucose-Capillary 271 (*) 70 - 99 mg/dL  CREATININE, SERUM     Status: Abnormal   Collection Time    05/31/13  6:47 AM      Result Value Range   Creatinine, Ser 0.69  0.50 - 1.10 mg/dL   GFR calc non Af Amer 85 (*) >90 mL/min   GFR calc Af Amer >90  >90 mL/min   Comment: (NOTE)     The eGFR has been calculated using the CKD EPI equation.     This calculation has not  been validated in all clinical situations.     eGFR's persistently <90 mL/min signify possible Chronic Kidney     Disease.  GLUCOSE, CAPILLARY     Status: Abnormal   Collection Time    05/31/13  7:31 AM      Result Value Range   Glucose-Capillary 262 (*) 70 - 99 mg/dL   Comment 1 Notify RN       HEENT: normal Cardio: RRR and no murmur Resp: CTA B/L and unlabored GI: BS positive and non distended Extremity:  Pulses positive and No Edema Skin:   Intact Neuro: Alert/Oriented, Cranial Nerve Abnormalities R central 7,, Abnormal Sensory reduced sensory on Right side and Abnormal Motor 0/5 in RUE, trace hip ext otherwise 0/5 RLE Musc/Skel:  Other pain with R wrist ROM, no swelling or erythema Gen NAD   Assessment/Plan: 1. 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 Physiatrist is providing close team supervision and 24 hour management of active medical problems listed below. Physiatrist and rehab team continue to assess barriers to discharge/monitor patient progress toward functional and medical goals.  FIM: FIM - Bathing Bathing Steps Patient Completed: Chest;Right Arm;Abdomen;Right upper leg;Left upper leg;Front perineal area Bathing: 3: Mod-Patient completes 5-7 58f 10 parts or 50-74%  FIM - Upper Body Dressing/Undressing Upper body dressing/undressing steps patient completed: Put head through opening of pull over shirt/dress;Thread/unthread left sleeve of pullover shirt/dress Upper body dressing/undressing: 3: Mod-Patient completed 50-74% of tasks FIM - Lower Body Dressing/Undressing Lower body dressing/undressing steps patient completed: Thread/unthread left pants leg Lower body dressing/undressing: 1: Total-Patient completed less  than 25% of tasks  FIM - Toileting Toileting: 1: Two helpers  FIM - Archivist  Transfers: 1-Two helpers  FIM - Banker Devices: Arm rests;Sliding board Bed/Chair Transfer: 2: Bed > Chair or W/C: Max A (lift and lower assist)  FIM - Locomotion: Wheelchair Distance: Pt unable to attempt due to LEs not able to reach floor.  Locomotion: Wheelchair: 1: Total Assistance/staff pushes wheelchair (Pt<25%) FIM - Locomotion: Ambulation Locomotion: Ambulation Assistive Devices:  (L handrail) Ambulation/Gait Assistance: 1: +2 Total assist (Max assist w/ +2 for chair follow) Locomotion: Ambulation: 0: Activity did not occur  Comprehension Comprehension Mode: Auditory Comprehension: 3-Understands basic 50 - 74% of the time/requires cueing 25 - 50%  of the time  Expression Expression Mode: Verbal Expression: 3-Expresses basic 50 - 74% of the time/requires cueing 25 - 50% of the time. Needs to repeat parts of sentences.  Social Interaction Social Interaction Mode: Asleep Social Interaction: 3-Interacts appropriately 50 - 74% of the time - May be physically or verbally inappropriate.  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 DVT Prophylaxis/Anticoagulation: Pharmaceutical: Lovenox  2. Pain Management: Will continue prn tramadol for chronic RLE/right knee pain.  3. Mood: difficulty to ascertain with language as well as language barrier. Family to assist with input. LCSW to follow for evaluation. Appeared to be fairly up beat on examination today.  4. Neuropsych: This patient is not capable of making decisions on her own behalf.  5. HTN: Will monitor with bid checks. 6. DM type 2: Continue metformin bid. Monitor BS with ac/hs checks and use  SSI for elevated BS. Titrate as indicated for BS control.  7. H/o angina: Will continue Ranexa. Negative cath reported this summer 8. Iron deficiency anemia: will add iron supplement. Check Vit B12 levels.  9.  Urinary retention and proteus UTI, started on Keflex for 7 days    LOS (Days) 7 A FACE TO FACE EVALUATION WAS PERFORMED  Deondray Ospina E 05/31/2013, 10:41 AM

## 2013-05-31 NOTE — Progress Notes (Signed)
Occupational Therapy Session Note  Patient Details  Name: Stacie [name] MRN: [id number] Date of Birth: [birth year]  Today's Date: 05/31/2013 Time: 1002-1101 Time Calculation (min): 59 min  Second Session: Time:  13:01-13:33 Time Calculation (min): 32 mins  Short Term Goals: Week 1:  OT Short Term Goal 1 (Week 1): Pt will perform UB dressing with min assist following hemi techniques. OT Short Term Goal 2 (Week 1): Pt will donn pullover pants with mod assist using AE PRN. OT Short Term Goal 3 (Week 1): Pt will transfer to drop arm commode with max assist. OT Short Term Goal 4 (Week 1): Pt will perform toilet hygiene and clothing management with max assist sit to stand. OT Short Term Goal 5 (Week 1): Pt will tolerate RUE splint wear for increased positioning.  Skilled Therapeutic Interventions/Progress Updates:    Session 1:  Worked on bathing and dressing during session.  Performed shower transfer with max assist stand pivot to the tub bench.  Pt's granddaughter and translator present for session as well.  Pt able to perform UB bathing with min assist.  Needs max assist to integrate the RUE into the bathing tasks.  Educated pt on using the RUE as a stabilizer to help hold deodorant and washcloth as well.  Currently pt unable to reach either LE for bathing so provided LH sponge for use.  Max instructional cueing needed to incorporate during session.  Performed dressing sit to stand at the sink.  She needs max instructional cueing for hemi techniques to begin with dressing the right side first.  Mod assist for sit to stand from the wheelchair with max instructional cueing for hand placement.  She likes to pull up on the sink instead of pushing off of the arm of the chair.  Therapist worked on Field seismologist for [employer] but pt needs max demonstrational cueing to utilize.    Session 2:  During session worked on having pt brush her teeth and remove her dentures for  soaking at wheelchair level.  She was able to use the RUE as a stabilizer with mod assist to hold the toothpaste for removing the top and then squirted toothpaste on the tooth brush with max assist for tip to tip pinch.  Pt brushed her teeth in sitting with overall supervision.  When finished, transferred to the bed with max assist stand pivot.  Educated pt and granddaughter on AAROM exercises for the RUE.  Pt performed 10 repetitions for shoulder flexion, elbow flexion, supination/pronation, wrist flexion/extension, and digit flexion/extension.  Pt is able to demonstrate trace shoulder and elbow movements but is currently demonstrating increased active movement more distally than proximally.  She was able to demonstrate 30% of pronation and supination, 60% of wrist flexion/extension, and 90% gross extension and 70% gross flexion.  Provided handout with pt and daughter as well to perform exercises on there own.  Therapy Documentation Precautions:  Precautions Precautions: Fall Precaution Comments: dense R hemiplegia, pain in R wrist (maybe from fall), pain in R knee, expressive aphasia Restrictions Weight Bearing Restrictions: No  Pain: Pain Assessment Pain Assessment: Faces Faces Pain Scale: Hurts even more Pain Type: Acute pain Pain Location: Neck Pain Orientation: Lower Pain Descriptors / Indicators: Aching (Pt reported pain due to position during sleep) Pain Onset: On-going Pain Intervention(s): RN made aware Multiple Pain Sites: No ADL: See FIM for current functional status  Therapy/Group: Individual Therapy  Chirstina Haan OTR/L 05/31/2013, 3:24 PM

## 2013-06-01 ENCOUNTER — Encounter (HOSPITAL_COMMUNITY): Payer: Self-pay | Admitting: Occupational Therapy

## 2013-06-01 ENCOUNTER — Inpatient Hospital Stay (HOSPITAL_COMMUNITY): Payer: Medicaid Other | Admitting: Speech Pathology

## 2013-06-01 ENCOUNTER — Inpatient Hospital Stay (HOSPITAL_COMMUNITY): Payer: Self-pay | Admitting: Rehabilitation

## 2013-06-01 LAB — GLUCOSE, CAPILLARY
Glucose-Capillary: 215 mg/dL — ABNORMAL HIGH (ref 70–99)
Glucose-Capillary: 187 mg/dL — ABNORMAL HIGH (ref 70–99)
Glucose-Capillary: 170 mg/dL — ABNORMAL HIGH (ref 70–99)

## 2013-06-01 MED ORDER — TAMSULOSIN HCL 0.4 MG PO CAPS
0.4000 mg | ORAL_CAPSULE | Freq: Every day | ORAL | Status: DC
  Administered 2013-06-01 – 2013-06-17 (×17): 0.4 mg via ORAL
  Filled 2013-06-01 (×17): qty 1

## 2013-06-01 NOTE — Progress Notes (Signed)
Speech Language Pathology Daily Session Note  Patient Details  Name: Stacie [name] MRN: [id number] Date of Birth: 16-Oct-1940  Today's Date: 06/01/2013 Time: 4098-1191 Time Calculation (min): 43 min  Short Term Goals: Week 1: SLP Short Term Goal 1 (Week 1): Patient will solve basic problems with Min verbal and questioning cues. SLP Short Term Goal 2 (Week 1): Patient will utilize memory aids to recall basic, daily information with Min verbal and questioning cues. SLP Short Term Goal 3 (Week 1): Patient will verbalize basic wants/needs with Mod verbal and questioning cues.  Skilled Therapeutic Interventions: Skilled treatment session focused on cognitive-linguistic and dysphagia goals.  SLP facilitated session with trials of regular textures; no overt s/s of aspiration were observed.  Patient reported that chewing was easier compared to last week, and she would like to get regular textures for lunch today.  SLP still recommends full supervision to ensure utilization of safe swallow strategies, i.e. small bites and sips and eating at a slow pace.  SLP also facilitated session with having patient utilize her schedule for the day; patient required Max verbal and visual cues to utilize the aid effectively, although she accurately utilized the clock spontaneously during the session.  SLP also presented patient with mildly complex money management task; patient required Max verbal and visual cues to problem solve through task.  It seems that concrete tasks in a context that makes sense are most beneficial for patient as she has trouble with abstract thinking and mental flexibility.  It is recommended that tasks focus on functionally completing tasks in the kitchen, washing clothes, using signs in the hospital, etc.; otherwise, continue with current plan of care.   FIM:  Comprehension Comprehension Mode: Auditory Comprehension: 3-Understands basic 50 - 74% of the time/requires cueing  25 - 50%  of the time Expression Expression Mode: Verbal Expression: 4-Expresses basic 75 - 89% of the time/requires cueing 10 - 24% of the time. Needs helper to occlude trach/needs to repeat words. Social Interaction Social Interaction: 4-Interacts appropriately 75 - 89% of the time - Needs redirection for appropriate language or to initiate interaction. Problem Solving Problem Solving: 3-Solves basic 50 - 74% of the time/requires cueing 25 - 49% of the time Memory Memory: 3-Recognizes or recalls 50 - 74% of the time/requires cueing 25 - 49% of the time FIM - Eating Eating Activity: 5: Needs verbal cues/supervision  Pain Pain Assessment Pain Assessment: No/denies pain Pain Score: 0-No pain Pain Location: Head Pain Radiating Towards: neck Pain Descriptors / Indicators: Aching Pain Frequency: Intermittent Pain Onset: Gradual Pain Intervention(s): Medication (See eMAR)  Therapy/Group: Individual Therapy  [name] 06/01/2013, 11:11 AM

## 2013-06-01 NOTE — Progress Notes (Signed)
The weekly progress note has been reviewed and SLP is in agreement. Tarig Zimmers, M.A., CCC-SLP [phone]  

## 2013-06-01 NOTE — Progress Notes (Signed)
Physical Therapy Weekly Progress Note  Patient Details  Name: Stacie [name] MRN: [id number] Date of Birth: 1940-08-08  Today's Date: 06/01/2013 Time: 6295-2841 Time Calculation (min): 56 min  Patient has met 4 of 5 short term goals.  Pt making very good gains with all aspects of mobility, however does demonstrate difficulty with bed mobility mostly due to body habitus and short arm length, preventing her from fully self assisting trunk into sitting.  Introduced RW today during sessions with RLE wrapped with ace wrap to provide DF assist for improved foot clearance.  She initially required assist for fully advance RLE, however during last pm session she was able to advance RLE on her own with assist for weight shift only.  Feel that she is on target towards meeting her long term goals at this time.   Patient continues to demonstrate the following deficits: decreased functional strength in RUE/LE, decreased balance, decreased sustained and alternating attention, gait abnormality, decreased safety awarenss and therefore will continue to benefit from skilled PT intervention to enhance overall performance with activity tolerance, balance, postural control, ability to compensate for deficits, functional use of  right upper extremity and right lower extremity, attention, awareness and knowledge of precautions.  Patient progressing toward long term goals..  Continue plan of care.  PT Short Term Goals Week 1:  PT Short Term Goal 1 (Week 1): Pt will perform bed mobility at mod assist consistently with HOB flat PT Short Term Goal 1 - Progress (Week 1): Progressing toward goal PT Short Term Goal 2 (Week 1): Pt will perform squat pivot transfer at max assist R or L with safe technique PT Short Term Goal 2 - Progress (Week 1): Met PT Short Term Goal 3 (Week 1): Pt will perform dynamic sitting balance at min assist PT Short Term Goal 3 - Progress (Week 1): Met PT Short Term Goal 4 (Week  1): Pt will perform standing balance with max assist of one person to perform functional activities PT Short Term Goal 4 - Progress (Week 1): Met PT Short Term Goal 5 (Week 1): Pt will ambulate x 20' w/ LRAD with max assist (+2 for chair follow) PT Short Term Goal 5 - Progress (Week 1): Met Week 2:  PT Short Term Goal 1 (Week 2): Pt will perform bed mobility at mod assist consistently with HOB flat PT Short Term Goal 2 (Week 2): Pt will perform squat pivot transfer at mod assist R or L with safe technique PT Short Term Goal 3 (Week 2): Pt will perform standing balance with mod assist of one person to perform functional activities PT Short Term Goal 4 (Week 2): Pt will ambulate x 20' w/ LRAD with max assist of one person  Skilled Therapeutic Interventions/Progress Updates:   First PM session:  Pt received sitting in w/c in room, attempted to get out of chair, therefore assisted to gym in w/c to initiate gait training.  Provided pt with youth size RW with R hand splint for support and applied R LE ace wrap for increased DF assist to clear foot with swing phase of gait.  Provided mod to max assist at times (+2 for chair follow) with manual facilitation for weight shift L to clear RLE and increased weight shift R for increased WB through extremity during stance phase.  Note that her RLE demonstrates good control and does not buckle during gait.  She also requires some assist to negotiate RW safely as she tends to get RW  too far ahead of her at times.  Performed 25' x 1 and another 20' x 1 with use of RW.  Pt seems to have some increased enthusiasm about gait this afternoon vs during yesterdays session.   Assisted back to room and left pt in w/c with granddaughter in law present.  Pt refused to wear safety belt, therefore stated that granddaughter in law would need to be present or pt would need to return to bed. Family states she can stay somewhat longer, therefore left in chair with all needs in reach.     Second PM session:  Pt received sitting in w/c in room.  Assisted pt to gym via w/c to participate in seated/standing NMR and also continued gait training with RW and R hand splint.  Performed stand pivot transfer w/c to mat with RW at mod/max assist.  Pt did well advancing LEs forward, however required max verbal, demonstration and manual assist to back up LEs to get to mat surface.  Once seated performed standing exercise reaching R and L for increased trunk lengthening and to increased weight shift and weight bearing through RLE.  Again, did not note pts R knee to buckle during activity.  Also performed seated reaching activity with back unsupported and feet unsupported to work on trunk shortening and lengthening.  Also note that pt is actively attempting to use RUE during activity.  Progressed to gait training with RW, R UE hand splint on RW, and ace wrap on RLE.  Provided same assist and cues mentioned in previous session, however on second attempt of ambulation, note that pt able to clear RLE on her own with assist only for weight shift L for better clearance.  Ambulated x 25' x 2 reps with seated rest break in between.  Assisted back to room and transferred back to bed via squat pivot at max assist with small step under LEs for improved weight bearing through LEs.  Assisted RLE into bed.  Bed alarm set and all needs in reach.     Therapy Documentation Precautions:  Precautions Precautions: Fall Precaution Comments: dense R hemiplegia, pain in R wrist (maybe from fall), pain in R knee, expressive aphasia Restrictions Weight Bearing Restrictions: No   Vital Signs: Therapy Vitals Temp: 98 F (36.7 C) Temp src: Oral Pulse Rate: 72 Resp: 18 BP: 109/71 mmHg Patient Position, if appropriate: Lying Oxygen Therapy SpO2: 94 % Pain: Pain Assessment Pain Assessment: No/denies pain   Locomotion : Ambulation Ambulation/Gait Assistance: 1: +2 Total assist (mod/max with chair follow)   See  FIM for current functional status  Therapy/Group: Individual Therapy  Vista Deck 06/01/2013, 4:09 PM

## 2013-06-01 NOTE — Progress Notes (Signed)
The skilled treatment note has been reviewed and SLP is in agreement. Tynesha Free, M.A., CCC-SLP [phone]  

## 2013-06-01 NOTE — Progress Notes (Signed)
Occupational Therapy Weekly Progress Note  Patient Details  Name: Stacie [name] MRN: [id number] Date of Birth: 1940/12/04  Today's Date: 06/01/2013 Time: 1103-1202 Time Calculation (min): 59 min  Patient has met 4 of 5 short term goals.  Ms.Leonardo Miriam Benitez Billie Ruddy is demonstrating increased functional movement in the RUE and hand to a Brunnstrum stage III in the arm and stage IV in the hand.  She has also improved with her transfers to a max assist level with both toilet and shower transfers as well as mod assist for sit to stand when stabilizing her UEs on the sink, during dressing tasks.  She does still exhibit limited RLE movement and control and needs therapist to stabilize the knee with standing to avoid hyperextension.  Recommend continued OT to continue progressing to min assist level goals for home.    Patient continues to demonstrate the following deficits: decreased balance, decreased right UE and LE strength, decreased coordination, expressive difficulties, and decreased safety awareness. and therefore will continue to benefit from skilled OT intervention to enhance overall performance with BADL.  Patient progressing toward long term goals..  Continue plan of care.  OT Short Term Goals Week 2:  OT Short Term Goal 1 (Week 2): Pt will use the RUE as a stabilizer during selfcare tasks with supervision and min instructional cueing. OT Short Term Goal 2 (Week 2): Pt will perform LB bathing sit to stand with mod assist using AE PRN. OT Short Term Goal 3 (Week 2): Pt will perform LB dressing sit to stand to donn pullover pants with mod assist.  OT Short Term Goal 4 (Week 2): Pt will perform stand/squat pivot toilet transfer with mod assist to drop arm commode.  Skilled Therapeutic Interventions/Progress Updates:    Pt worked on bathing and dressing at the sink this session.  She chose to sponge bathe since she took a shower yesterday.  Integrated the RUE  during session as a stabilizer for holding washcloth with min assist and mod instructional cueing and as an active assist for bathing tasks with mod to max assist.  Pt demonstrating increased digit flexion and extension over the past few days and can now hold her deodorant for opening where before this session she needed max assist.  She was able to perform sit to stand at the sink for washing peri area with mod assist as therapist helped her wash and also for pulling brief and pants over her hips.  Attempted to integrate reacher for LB dressing but pt still resistant to use and needs max demonstrational cueing.  Pt also continues to need max instructional cueing to follow hemi dressing techniques and begin donning clothing on the right side.  She tends to try and donn it on the left instead.    Therapy Documentation Precautions:  Precautions Precautions: Fall Precaution Comments: dense R hemiplegia, pain in R wrist (maybe from fall), pain in R knee, expressive aphasia Restrictions Weight Bearing Restrictions: No Pain: Pain Assessment Pain Assessment: No/denies pain Pain Score: 0-No pain ADL: See FIM for current functional status  Therapy/Group: Individual Therapy  Rameses Ou OTR/L 06/01/2013, 12:43 PM

## 2013-06-01 NOTE — Progress Notes (Addendum)
Subjective/Complaints: "no dolor" Appears comfortable in bed today. Smiling  Review of Systems - no translator today  Objective: Vital Signs: Blood pressure 133/52, pulse 74, temperature 98 F (36.7 C), temperature source Oral, resp. rate 18, weight 80.2 kg (176 lb 12.9 oz), SpO2 94.00%. No results found. Results for orders placed during the hospital encounter of 05/24/13 (from the past 72 hour(s))  GLUCOSE, CAPILLARY     Status: Abnormal   Collection Time    05/29/13 12:42 PM      Result Value Range   Glucose-Capillary 290 (*) 70 - 99 mg/dL  GLUCOSE, CAPILLARY     Status: Abnormal   Collection Time    05/29/13  1:07 PM      Result Value Range   Glucose-Capillary 259 (*) 70 - 99 mg/dL  GLUCOSE, CAPILLARY     Status: Abnormal   Collection Time    05/29/13  4:45 PM      Result Value Range   Glucose-Capillary 117 (*) 70 - 99 mg/dL  GLUCOSE, CAPILLARY     Status: Abnormal   Collection Time    05/29/13  8:36 PM      Result Value Range   Glucose-Capillary 282 (*) 70 - 99 mg/dL  GLUCOSE, CAPILLARY     Status: Abnormal   Collection Time    05/30/13  7:32 AM      Result Value Range   Glucose-Capillary 220 (*) 70 - 99 mg/dL  GLUCOSE, CAPILLARY     Status: Abnormal   Collection Time    05/30/13 12:23 PM      Result Value Range   Glucose-Capillary 345 (*) 70 - 99 mg/dL   Comment 1 Documented in Chart    GLUCOSE, CAPILLARY     Status: Abnormal   Collection Time    05/30/13  5:02 PM      Result Value Range   Glucose-Capillary 240 (*) 70 - 99 mg/dL  GLUCOSE, CAPILLARY     Status: Abnormal   Collection Time    05/30/13  8:48 PM      Result Value Range   Glucose-Capillary 271 (*) 70 - 99 mg/dL  CREATININE, SERUM     Status: Abnormal   Collection Time    05/31/13  6:47 AM      Result Value Range   Creatinine, Ser 0.69  0.50 - 1.10 mg/dL   GFR calc non Af Amer 85 (*) >90 mL/min   GFR calc Af Amer >90  >90 mL/min   Comment: (NOTE)     The eGFR has been calculated using the  CKD EPI equation.     This calculation has not been validated in all clinical situations.     eGFR's persistently <90 mL/min signify possible Chronic Kidney     Disease.  GLUCOSE, CAPILLARY     Status: Abnormal   Collection Time    05/31/13  7:31 AM      Result Value Range   Glucose-Capillary 262 (*) 70 - 99 mg/dL   Comment 1 Notify RN    GLUCOSE, CAPILLARY     Status: Abnormal   Collection Time    05/31/13 11:36 AM      Result Value Range   Glucose-Capillary 245 (*) 70 - 99 mg/dL  BASIC METABOLIC PANEL     Status: Abnormal   Collection Time    05/31/13  2:20 PM      Result Value Range   Sodium 136  135 - 145 mEq/L   Potassium 4.2  3.5 - 5.1 mEq/L   Chloride 101  96 - 112 mEq/L   CO2 27  19 - 32 mEq/L   Glucose, Bld 329 (*) 70 - 99 mg/dL   BUN 28 (*) 6 - 23 mg/dL   Creatinine, Ser 1.61  0.50 - 1.10 mg/dL   Calcium 8.9  8.4 - 09.6 mg/dL   GFR calc non Af Amer 61 (*) >90 mL/min   GFR calc Af Amer 70 (*) >90 mL/min   Comment: (NOTE)     The eGFR has been calculated using the CKD EPI equation.     This calculation has not been validated in all clinical situations.     eGFR's persistently <90 mL/min signify possible Chronic Kidney     Disease.  GLUCOSE, CAPILLARY     Status: Abnormal   Collection Time    05/31/13  4:32 PM      Result Value Range   Glucose-Capillary 276 (*) 70 - 99 mg/dL  GLUCOSE, CAPILLARY     Status: Abnormal   Collection Time    05/31/13  8:36 PM      Result Value Range   Glucose-Capillary 269 (*) 70 - 99 mg/dL   Comment 1 Notify RN    GLUCOSE, CAPILLARY     Status: Abnormal   Collection Time    06/01/13  7:36 AM      Result Value Range   Glucose-Capillary 215 (*) 70 - 99 mg/dL   Comment 1 Notify RN       HEENT: normal Cardio: RRR and no murmur Resp: CTA B/L and unlabored GI: BS positive and non distended Extremity:  Pulses positive and No Edema Skin:   Intact Neuro: Alert/Oriented, Cranial Nerve Abnormalities R central 7,, Abnormal Sensory  reduced sensory on Right side and Abnormal Motor 0/5 in RUE, trace hip ext otherwise 0/5 RLE Musc/Skel: No pain with R wrist or shoulder ROM, no swelling or erythema Gen NAD   Assessment/Plan: 1. 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 Physiatrist is providing close team supervision and 24 hour management of active medical problems listed below. Physiatrist and rehab team continue to assess barriers to discharge/monitor patient progress toward functional and medical goals.  FIM: FIM - Bathing Bathing Steps Patient Completed: Chest;Right Arm;Abdomen;Right upper leg;Left upper leg Bathing: 3: Mod-Patient completes 5-7 50f 10 parts or 50-74%  FIM - Upper Body Dressing/Undressing Upper body dressing/undressing steps patient completed: Thread/unthread left sleeve of pullover shirt/dress;Pull shirt over trunk Upper body dressing/undressing: 3: Mod-Patient completed 50-74% of tasks FIM - Lower Body Dressing/Undressing Lower body dressing/undressing steps patient completed: Thread/unthread left pants leg Lower body dressing/undressing: 1: Total-Patient completed less than 25% of tasks  FIM - Toileting Toileting: 1: Two helpers  FIM - Archivist Transfers: 1-Two helpers  FIM - Banker Devices: Arm rests;Sliding board Bed/Chair Transfer: 2: Supine > Sit: Max A (lifting assist/Pt. 25-49%);4: Sit > Supine: Min A (steadying pt. > 75%/lift 1 leg);2: Bed > Chair or W/C: Max A (lift and lower assist);2: Chair or W/C > Bed: Max A (lift and lower assist)  FIM - Locomotion: Wheelchair Distance: Pt unable to attempt due to LEs not able to reach floor.  Locomotion: Wheelchair: 1: Total Assistance/staff pushes wheelchair (Pt<25%) FIM - Locomotion: Ambulation Locomotion: Ambulation Assistive  Devices:  (L handrail) Ambulation/Gait Assistance: 1: +2 Total assist Locomotion: Ambulation:  1: Two helpers  Comprehension Comprehension Mode: Auditory Comprehension: 3-Understands basic 50 - 74% of the time/requires cueing 25 - 50%  of the time  Expression Expression Mode: Verbal Expression: 4-Expresses basic 75 - 89% of the time/requires cueing 10 - 24% of the time. Needs helper to occlude trach/needs to repeat words.  Social Interaction Social Interaction Mode: Asleep Social Interaction: 4-Interacts appropriately 75 - 89% of the time - Needs redirection for appropriate language or to initiate interaction.  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 DVT Prophylaxis/Anticoagulation: Pharmaceutical: Lovenox  2. Pain Management: Will continue prn tramadol for chronic RLE/right knee pain.  3. Mood: difficulty to ascertain with language as well as language barrier. Family to assist with input. LCSW to follow for evaluation. Appeared to be fairly up beat on examination today.  4. Neuropsych: This patient is not capable of making decisions on her own behalf.  5. HTN: Will monitor with bid checks. 6. DM type 2: amaryl and SSI Monitor BS with ac/hs checks and use SSI for elevated BS. Titrate as indicated for BS control.  7. H/o angina: Will continue Ranexa. Negative cath reported this summer 8. Iron deficiency anemia: will add iron supplement. Check Vit B12 levels.  9.  Urinary retention and proteus UTI, started on Keflex for 7 days,on urecholine add flomax    LOS (Days) 8 A FACE TO FACE EVALUATION WAS PERFORMED  Haseeb Fiallos E 06/01/2013, 9:56 AM

## 2013-06-01 NOTE — Plan of Care (Signed)
Problem: RH BOWEL ELIMINATION Goal: RH STG MANAGE BOWEL WITH ASSISTANCE STG Manage Bowel with Max Assistance.  Outcome: Not Progressing LBM 05-28-13 Goal: RH STG MANAGE BOWEL W/MEDICATION W/ASSISTANCE STG Manage Bowel with Medication with min Assistance.  Outcome: Not Progressing LBM 05-28-13

## 2013-06-01 NOTE — Progress Notes (Signed)
Speech Language Pathology Weekly Progress Note  Patient Details  Name: Stacie [name] MRN: [id number] Date of Birth: July 24, 1940  Today's Date: 06/01/2013  Short Term Goals: Week 1: SLP Short Term Goal 1 (Week 1): Patient will solve basic problems with Min verbal and questioning cues. SLP Short Term Goal 1 - Progress (Week 1): Met SLP Short Term Goal 2 (Week 1): Patient will utilize memory aids to recall basic, daily information with Min verbal and questioning cues. SLP Short Term Goal 2 - Progress (Week 1): Progressing toward goal SLP Short Term Goal 3 (Week 1): Patient will verbalize basic wants/needs with Mod verbal and questioning cues. SLP Short Term Goal 3 - Progress (Week 1): Met Week 2: SLP Short Term Goal 1 (Week 2): Patient will tolerate regular textures and thin liquids with Min verbal cues to utilize safe swallow strategies. SLP Short Term Goal 2 (Week 2): Patient will verbalize basic wants/needs with Min questioning cues. SLP Short Term Goal 3 (Week 2): Patient will solve functional, complex problem solving in a understandable context with Mod verbal cues. SLP Short Term Goal 4 (Week 2): Patient will utilize external aids to recall basic, information with Mod verbal and questioning cues.  Weekly Progress Updates: Patient met 2 out of 3 short term goals this reporting period.  Patient made gains in functional basic problem solving, although continues to demonstrate deficits with mildly-complex functional problem solving.  Patient also made advancements with verbalizing basic wants/needs with some basic question cueing from the communication partner.  Tasks within appropriate contexts are most beneficial to patient as she presents with decreased mental flexibility and abstract thought processing.  Cognitive-linguistic deficits in addition to her dysphagia continue to be the most limiting factors for the patient.  It is recommended that patient continue to receive  skilled SLP services and continue to focus on functional tasks to maximize safety, increase functional independence, and reduce burden of care prior to discharge.   SLP Intensity: Minumum of 1-2 x/day, 30 to 90 minutes SLP Frequency: 5 out of 7 days SLP Duration/Estimated Length of Stay: Discharge 06/18/13 SLP Treatment/Interventions: Cognitive remediation/compensation;Cueing hierarchy;Dysphagia/aspiration precaution training;Functional tasks;Internal/external aids;Medication managment;Patient/family education;Therapeutic Activities SLP Eating/Swallowing Anticipated Outcome(s): Supervision SLP Communication Anticipated Outcome(s): Min A SLP Cognition Anticipated Outcome(s): Min A    Desma Mcgregor E 06/01/2013, 11:47 AM

## 2013-06-02 ENCOUNTER — Encounter (HOSPITAL_COMMUNITY): Payer: Self-pay | Admitting: Occupational Therapy

## 2013-06-02 ENCOUNTER — Inpatient Hospital Stay (HOSPITAL_COMMUNITY): Payer: Medicaid Other | Admitting: Physical Therapy

## 2013-06-02 ENCOUNTER — Inpatient Hospital Stay (HOSPITAL_COMMUNITY): Payer: Medicaid Other | Admitting: Speech Pathology

## 2013-06-02 ENCOUNTER — Inpatient Hospital Stay (HOSPITAL_COMMUNITY): Payer: Self-pay | Admitting: Occupational Therapy

## 2013-06-02 LAB — GLUCOSE, CAPILLARY
Glucose-Capillary: 164 mg/dL — ABNORMAL HIGH (ref 70–99)
Glucose-Capillary: 224 mg/dL — ABNORMAL HIGH (ref 70–99)

## 2013-06-02 MED ORDER — METFORMIN HCL 500 MG PO TABS
500.0000 mg | ORAL_TABLET | Freq: Two times a day (BID) | ORAL | Status: DC
  Administered 2013-06-02 – 2013-06-05 (×7): 500 mg via ORAL
  Filled 2013-06-02 (×10): qty 1

## 2013-06-02 MED ORDER — SORBITOL 70 % SOLN
45.0000 mL | Freq: Once | Status: DC
  Filled 2013-06-02: qty 60

## 2013-06-02 MED ORDER — INSULIN ASPART 100 UNIT/ML ~~LOC~~ SOLN
8.0000 [IU] | Freq: Three times a day (TID) | SUBCUTANEOUS | Status: DC
  Administered 2013-06-02 – 2013-06-07 (×16): 8 [IU] via SUBCUTANEOUS

## 2013-06-02 NOTE — Progress Notes (Signed)
Speech Language Pathology Daily Session Note  Patient Details  Name: Stacie [name] MRN: [id number] Date of Birth: [birth year]  Today's Date: 06/02/2013 Time: 4098-1191 Time Calculation (min): 35 min  Short Term Goals: Week 2: SLP Short Term Goal 1 (Week 2): Patient will tolerate regular textures and thin liquids with Min verbal cues to utilize safe swallow strategies. SLP Short Term Goal 2 (Week 2): Patient will verbalize basic wants/needs with Min questioning cues. SLP Short Term Goal 3 (Week 2): Patient will solve functional, complex problem solving in a understandable context with Mod verbal cues. SLP Short Term Goal 4 (Week 2): Patient will utilize external aids to recall basic, information with Mod verbal and questioning cues.  Skilled Therapeutic Interventions: Skilled treatment session focused on addressing cognitive-linguistic goals.  Interpreter present for session; SLP facilitated session with functional cooking task.  Auditory directions were provided to complete simple meal prep task and patient required Mod verbal cues to accurately recall information.  Patient also required increased wait time and Min question cues to plan task and request items and help as needed.  While patient demonstrated decreased working/new memory during cooking task; she was able to direct SLP back to her room at end of activity, which indicates that repetition is an effective strategy to use of carryover of new information.  RN reports that breakfast went well this am; continue with current plan of care.   FIM:  Comprehension Comprehension Mode: Auditory Comprehension: 3-Understands basic 50 - 74% of the time/requires cueing 25 - 50%  of the time Expression Expression Mode: Verbal Expression: 4-Expresses basic 75 - 89% of the time/requires cueing 10 - 24% of the time. Needs helper to occlude trach/needs to repeat words. Social Interaction Social Interaction: 4-Interacts  appropriately 75 - 89% of the time - Needs redirection for appropriate language or to initiate interaction. Problem Solving Problem Solving: 4-Solves basic 75 - 89% of the time/requires cueing 10 - 24% of the time Memory Memory: 3-Recognizes or recalls 50 - 74% of the time/requires cueing 25 - 49% of the time FIM - Eating Eating Activity: 5: Needs verbal cues/supervision  Pain Pain Assessment Pain Assessment: No/denies pain  Therapy/Group: Individual Therapy  Charlane Ferretti., CCC-SLP [phone]  Jearldean Gutt 06/02/2013, 1:46 PM

## 2013-06-02 NOTE — Progress Notes (Signed)
Physical Therapy Note  Patient Details  Name: Stacie [name] MRN: [id number] Date of Birth: 03/12/1941 Today's Date: 06/02/2013  4098-1191 (55 minutes) individual Pain: no reported pain Focus of treatment: Neuro re-ed RT LE; gait training Treatment: Pt up in wc upon arrival; interpreter present; sit to supine min/mod assist ; sidelying AA Rt hip flexion (gravity eliminated) with minimal initiation ; supine bilateral hip flexion/extension using therapy ball; bridging; sit to stand to RW with hand splint Rt + ace wrap RT ankle  Min assist ; gait 15 feet x 4 mod assist with initial max assist to advance Rt LE to min assist with repetition with hesitation of partial swing; returned to room with needs within reach and interpreter present.    Kyran Connaughton,JIM 06/02/2013, 9:40 AM

## 2013-06-02 NOTE — Progress Notes (Signed)
Social Work Patient ID: Stacie [name], female   DOB: 1940-07-25, 72 y.o.   MRN: [id number] Spoke with Meissa-granddaughter via telephone to inform of team conference progression toward goals and discharged 12/12.  Pt has made god progress this  Week and is ambulating now.  The family assures worker they will have 24 hour care for pt via family member.  Granddaughter in-law pt does not always get  Along with according to [facility].  Encouraged her to come in and attend therapies wit her, while out on break.

## 2013-06-02 NOTE — Patient Care Conference (Signed)
Inpatient RehabilitationTeam Conference and Plan of Care Update Date: 06/02/2013   Time: 10;40 AM    Patient Name: Stacie [name]      Medical Record Number: [id number]  Date of Birth: February 24, 1941 Sex: Female         Room/Bed: 4W07C/4W07C-01 Payor Info: Payor: MEDICAID POTENTIAL / Plan: MEDICAID POTENTIAL / Product Type: *No Product type* /    Admitting Diagnosis: L CVA  Admit Date/Time:  05/24/2013  1:08 PM Admission Comments: No comment available   Primary Diagnosis:  CVA (cerebral infarction) Principal Problem: CVA (cerebral infarction)  Patient Active Problem List   Diagnosis Date Noted  . CVA (cerebral infarction) 05/24/2013  . Type II or unspecified type diabetes mellitus with neurological manifestations, not stated as uncontrolled(250.60) 05/24/2013  . Dyslipidemia 05/24/2013  . GERD (gastroesophageal reflux disease) 05/24/2013  . Iron deficiency anemia, unspecified 05/24/2013    Expected Discharge Date: Expected Discharge Date: 06/18/13  Team Members Present: Physician leading conference: Dr. Claudette Laws Social Worker Present: Staci Acosta, LCSW;Becky Gilbert Narain, LCSW Nurse Present: Kennon Portela, RN PT Present: Edman Circle, PT OT Present: Rosalio Loud, Felipa Eth, OT SLP Present: Fae Pippin, SLP PPS Coordinator present : Tora Duck, RN, CRRN     Current Status/Progress Goal Weekly Team Focus  Medical   Right upper extremity improving, right lower extremity still very weak, diabetes uncontrolled  Maximize functional improvement for home discharge  Continue diabetes medication adjustment   Bowel/Bladder   inc of bowel and bladder  Managed Bowel and bladder   timed toiletting   Swallow/Nutrition/ Hydration   Regular textures and thin liquids with full supervision   Least restrictive p.o. intake  Intermittent supervision    ADL's   Min assist UB bathing and min to mod for UB dressing, total assist for LB dressing and mod assist for LB  bathing.  Increased movement noted in the right hand to Brunnstrum stage IV level and stage II-III in the arm.    Goals are overall min assist level for transfers and LB selfcare sit to stand.  Supervision for UB dressing, grooming, and self feeding.  selfcare retraining, neuromuscular re-education,  pt/family education   Mobility   Pt requires max assist for bed mobility (she is attempting to assist more, however body habitus preventing at this time), min to mod assist for slideboard transfers, mod to max assist for squat pivot with support under LEs, min assist for standing.  Began gait training with RW and R hand splint with ace wrap on RLE and demonstrates ability to clear R LE on her own if assisted with weight shifting.    Goals are min assist overall for transfers and mod assist for ambulation  dynamic standing balance, NMR for RUE/LE, gait training, transfer training, pt/family education.    Communication   Min-Mod assist  Supervision   initiation of expression of wants and needs   Safety/Cognition/ Behavioral Observations  Mod-Max assist  Supervision   increase recall of new information    Pain   no c/o pain  <3   assess for pain q shift   Skin   bruising in abd from lovenox  No skin breakdown  monitor and assess skin q shift      *See Care Plan and progress notes for long and short-term goals.  Barriers to Discharge: No insurance, non-English speaking, see above    Possible Resolutions to Barriers:  Maximize functional level while inpatient, utilize translator, see above    Discharge Planning/Teaching Needs:  Home with family providing 24 hr care-granddaugther in-law here daily and participating in therapies.  Pt feeling better about her progress this week      Team Discussion:  BS adjusted-started Metformin.  Tone better with zanaflex.  I & O cath still.  Turned the corner this week in therapies making good progress-ambulating  Revisions to Treatment Plan:  Upgraded  goals-ambulation to min level   Continued Need for Acute Rehabilitation Level of Care: The patient requires daily medical management by a physician with specialized training in physical medicine and rehabilitation for the following conditions: Daily direction of a multidisciplinary physical rehabilitation program to ensure safe treatment while eliciting the highest outcome that is of practical value to the patient.: Yes Daily medical management of patient stability for increased activity during participation in an intensive rehabilitation regime.: Yes Daily analysis of laboratory values and/or radiology reports with any subsequent need for medication adjustment of medical intervention for : Neurological problems;Other  Lucy Chris 06/04/2013, 8:49 AM

## 2013-06-02 NOTE — Progress Notes (Addendum)
Subjective/Complaints: OT reports improvement in right upper extremity movement  Review of Systems - R shoulder pain improving,still needs int cath no other c/os per translator  Objective: Vital Signs: Blood pressure 121/57, pulse 65, temperature 97.7 F (36.5 C), temperature source Oral, resp. rate 18, weight 80.2 kg (176 lb 12.9 oz), SpO2 95.00%. No results found. Results for orders placed during the hospital encounter of 05/24/13 (from the past 72 hour(s))  GLUCOSE, CAPILLARY     Status: Abnormal   Collection Time    05/30/13  5:02 PM      Result Value Range   Glucose-Capillary 240 (*) 70 - 99 mg/dL  GLUCOSE, CAPILLARY     Status: Abnormal   Collection Time    05/30/13  8:48 PM      Result Value Range   Glucose-Capillary 271 (*) 70 - 99 mg/dL  CREATININE, SERUM     Status: Abnormal   Collection Time    05/31/13  6:47 AM      Result Value Range   Creatinine, Ser 0.69  0.50 - 1.10 mg/dL   GFR calc non Af Amer 85 (*) >90 mL/min   GFR calc Af Amer >90  >90 mL/min   Comment: (NOTE)     The eGFR has been calculated using the CKD EPI equation.     This calculation has not been validated in all clinical situations.     eGFR's persistently <90 mL/min signify possible Chronic Kidney     Disease.  GLUCOSE, CAPILLARY     Status: Abnormal   Collection Time    05/31/13  7:31 AM      Result Value Range   Glucose-Capillary 262 (*) 70 - 99 mg/dL   Comment 1 Notify RN    GLUCOSE, CAPILLARY     Status: Abnormal   Collection Time    05/31/13 11:36 AM      Result Value Range   Glucose-Capillary 245 (*) 70 - 99 mg/dL  BASIC METABOLIC PANEL     Status: Abnormal   Collection Time    05/31/13  2:20 PM      Result Value Range   Sodium 136  135 - 145 mEq/L   Potassium 4.2  3.5 - 5.1 mEq/L   Chloride 101  96 - 112 mEq/L   CO2 27  19 - 32 mEq/L   Glucose, Bld 329 (*) 70 - 99 mg/dL   BUN 28 (*) 6 - 23 mg/dL   Creatinine, Ser 1.61  0.50 - 1.10 mg/dL   Calcium 8.9  8.4 - 09.6 mg/dL   GFR  calc non Af Amer 61 (*) >90 mL/min   GFR calc Af Amer 70 (*) >90 mL/min   Comment: (NOTE)     The eGFR has been calculated using the CKD EPI equation.     This calculation has not been validated in all clinical situations.     eGFR's persistently <90 mL/min signify possible Chronic Kidney     Disease.  GLUCOSE, CAPILLARY     Status: Abnormal   Collection Time    05/31/13  4:32 PM      Result Value Range   Glucose-Capillary 276 (*) 70 - 99 mg/dL  GLUCOSE, CAPILLARY     Status: Abnormal   Collection Time    05/31/13  8:36 PM      Result Value Range   Glucose-Capillary 269 (*) 70 - 99 mg/dL   Comment 1 Notify RN    GLUCOSE, CAPILLARY     Status:  Abnormal   Collection Time    06/01/13  7:36 AM      Result Value Range   Glucose-Capillary 215 (*) 70 - 99 mg/dL   Comment 1 Notify RN    GLUCOSE, CAPILLARY     Status: Abnormal   Collection Time    06/01/13 12:13 PM      Result Value Range   Glucose-Capillary 187 (*) 70 - 99 mg/dL   Comment 1 Notify RN    GLUCOSE, CAPILLARY     Status: Abnormal   Collection Time    06/01/13  4:40 PM      Result Value Range   Glucose-Capillary 170 (*) 70 - 99 mg/dL   Comment 1 Notify RN    GLUCOSE, CAPILLARY     Status: Abnormal   Collection Time    06/01/13  8:54 PM      Result Value Range   Glucose-Capillary 251 (*) 70 - 99 mg/dL   Comment 1 Notify RN    GLUCOSE, CAPILLARY     Status: Abnormal   Collection Time    06/02/13  7:26 AM      Result Value Range   Glucose-Capillary 229 (*) 70 - 99 mg/dL   Comment 1 Notify RN    GLUCOSE, CAPILLARY     Status: Abnormal   Collection Time    06/02/13 11:25 AM      Result Value Range   Glucose-Capillary 359 (*) 70 - 99 mg/dL   Comment 1 Notify RN       HEENT: normal Cardio: RRR and no murmur Resp: CTA B/L and unlabored GI: BS positive and non distended Extremity:  Pulses positive and No Edema Skin:   Intact Neuro: Alert/Oriented, Cranial Nerve Abnormalities R central 7,, Abnormal Sensory  reduced sensory on Right side and Abnormal Motor 2-/5 in RUE, trace hip ext otherwise 0/5 RLE Musc/Skel: No pain with R wrist or shoulder ROM, no swelling or erythema Gen NAD   Assessment/Plan: 1. 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 Physiatrist is providing close team supervision and 24 hour management of active medical problems listed below. Physiatrist and rehab team continue to assess barriers to discharge/monitor patient progress toward functional and medical goals.  FIM: FIM - Bathing Bathing Steps Patient Completed: Chest;Right Arm;Abdomen;Right upper leg;Left upper leg;Left lower leg (including foot) Bathing: 3: Mod-Patient completes 5-7 53f 10 parts or 50-74%  FIM - Upper Body Dressing/Undressing Upper body dressing/undressing steps patient completed: Thread/unthread left sleeve of pullover shirt/dress;Put head through opening of pull over shirt/dress Upper body dressing/undressing: 3: Mod-Patient completed 50-74% of tasks FIM - Lower Body Dressing/Undressing Lower body dressing/undressing steps patient completed: Thread/unthread left pants leg Lower body dressing/undressing: 1: Total-Patient completed less than 25% of tasks  FIM - Toileting Toileting: 1: Two helpers  FIM - Archivist Transfers: 1-Two helpers  FIM - Banker Devices: Arm rests Bed/Chair Transfer: 2: Supine > Sit: Max A (lifting assist/Pt. 25-49%);2: Bed > Chair or W/C: Max A (lift and lower assist)  FIM - Locomotion: Wheelchair Distance: Pt unable to attempt due to LEs not able to reach floor.  Locomotion: Wheelchair: 1: Total Assistance/staff pushes wheelchair (Pt<25%) FIM - Locomotion: Ambulation Locomotion: Ambulation Assistive Devices: Walker - Rolling (R handsplint) Ambulation/Gait  Assistance: 1: +2 Total assist (mod/max with chair follow) Locomotion: Ambulation: 1: Two helpers  Comprehension Comprehension  Mode: Auditory Comprehension: 3-Understands basic 50 - 74% of the time/requires cueing 25 - 50%  of the time  Expression Expression Mode: Verbal Expression: 4-Expresses basic 75 - 89% of the time/requires cueing 10 - 24% of the time. Needs helper to occlude trach/needs to repeat words.  Social Interaction Social Interaction Mode: Asleep Social Interaction: 4-Interacts appropriately 75 - 89% of the time - Needs redirection for appropriate language or to initiate interaction.  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 DVT Prophylaxis/Anticoagulation: Pharmaceutical: Lovenox  2. Pain Management: Will continue prn tramadol for chronic RLE/right knee pain.  3. Mood: difficulty to ascertain with language as well as language barrier. Family to assist with input. LCSW to follow for evaluation. Appeared to be fairly up beat on examination today.  4. Neuropsych: This patient is not capable of making decisions on her own behalf.  5. HTN: Will monitor with bid checks. 6. DM type 2: amaryl and SSI Monitor BS with ac/hs checks and use SSI for elevated BS. Titrate as indicated for BS control. Poor control add metformin 7. H/o angina: Will continue Ranexa. Negative cath reported this summer 8. Iron deficiency anemia: will add iron supplement. Check Vit B12 levels.  9.  Urinary retention and proteus UTI, started on Keflex for 7 days,on urecholine add flomax, starting to void    LOS (Days) 9 A FACE TO FACE EVALUATION WAS PERFORMED  KIRSTEINS,ANDREW E 06/02/2013, 2:18 PM

## 2013-06-02 NOTE — Progress Notes (Signed)
Occupational Therapy Session Note  Patient Details  Name: Stacie [name] MRN: [id number] Date of Birth: 10/24/1940  Today's Date: 06/02/2013 Time: 1330-1400 Time Calculation (min): 30 min  Skilled Therapeutic Interventions/Progress Updates:    Pt went down to the OT gym and worked on neuromuscular re-education sitting EOM.  Began with pt sustaining active shoulder flexion in the RUE while placing her hand on the therapist's shoulder.  She demonstrated difficulty maintaining contact with therapist's shoulder without relaxing and letting her RUE fall down to her side.  Transitioned to more functional task of having he pt reach for cups on the bedside table and moving them to the mat beside of her.  She needed min assist to complete this task with therapist providing facilitation at the elbow to maintain shoulder flexion.  Also incorporated tilted stool for isolated shoulder flexion and elbow extension during session as well.  Pt able to perform transfers stand pivot to the mat and to the wheelchair with mod assist.   Therapy Documentation Precautions:  Precautions Precautions: Fall Precaution Comments: dense R hemiplegia, pain in R wrist (maybe from fall), pain in R knee, expressive aphasia Restrictions Weight Bearing Restrictions: No  Pain: Pain Assessment Pain Assessment: No/denies pain ADL: See FIM for current functional status  Therapy/Group: Individual Therapy  Gionni Vaca OTR/L 06/02/2013, 3:53 PM

## 2013-06-02 NOTE — Progress Notes (Signed)
Occupational Therapy Session Note  Patient Details  Name: Stacie [name] MRN: [id number] Date of Birth: Aug 25, 1940  Today's Date: 06/02/2013 Time: 0800-0902 Time Calculation (min): 62 min  Short Term Goals: Week 2:  OT Short Term Goal 1 (Week 2): Pt will use the RUE as a stabilizer during selfcare tasks with supervision and min instructional cueing. OT Short Term Goal 2 (Week 2): Pt will perform LB bathing sit to stand with mod assist using AE PRN. OT Short Term Goal 3 (Week 2): Pt will perform LB dressing sit to stand to donn pullover pants with mod assist.  OT Short Term Goal 4 (Week 2): Pt will perform stand/squat pivot toilet transfer with mod assist to drop arm commode.  Skilled Therapeutic Interventions/Progress Updates:    Pt worked on bathing and dressing with sit to stand at the sink.  Translator present during session as well.  Pt able to use the RUE as an active assist for washing the left arm and part of her stomach with mod assist.  Incorporated it as a stabilizer for holding the washcloth while applying soap with min assist level.  Pt able to state which side to start with first during dressing tasks but needs mod assist to thread her shirt sleeve over the right arm.  Integrated reacher for donning brief over the right and left LE with max assist, as pt does not attempt to use the reacher.  Mod assist needed for static sit to stand and standing balance with bilateral UE support.  Increases to needing max assist for standing if she releases the surface and attempts to use the LUE to pull her pants over her hips.  Total assist needed for donning shoes and socks.    Therapy Documentation Precautions:  Precautions Precautions: Fall Precaution Comments: dense R hemiplegia, pain in R wrist (maybe from fall), pain in R knee, expressive aphasia Restrictions Weight Bearing Restrictions: No  Pain: Pain Assessment Pain Assessment: No/denies pain ADL: See FIM for  current functional status  Therapy/Group: Individual Therapy  Tuvia Woodrick OTR/L 06/02/2013, 11:18 AM

## 2013-06-03 LAB — GLUCOSE, CAPILLARY
Glucose-Capillary: 233 mg/dL — ABNORMAL HIGH (ref 70–99)
Glucose-Capillary: 267 mg/dL — ABNORMAL HIGH (ref 70–99)
Glucose-Capillary: 151 mg/dL — ABNORMAL HIGH (ref 70–99)
Glucose-Capillary: 93 mg/dL (ref 70–99)

## 2013-06-03 NOTE — Progress Notes (Signed)
Subjective/Complaints: Friend at bedside interpreting. Had some abdominal pain last night but improved after medication. Voiding but also requiring some intermittent catheterization  Review of Systems - R shoulder pain improving,still needs int cath no other c/os per translator  Objective: Vital Signs: Blood pressure 126/76, pulse 67, temperature 98.4 F (36.9 C), temperature source Oral, resp. rate 18, weight 79.833 kg (176 lb), SpO2 96.00%. No results found. Results for orders placed during the hospital encounter of 05/24/13 (from the past 72 hour(s))  GLUCOSE, CAPILLARY     Status: Abnormal   Collection Time    05/31/13 11:36 AM      Result Value Range   Glucose-Capillary 245 (*) 70 - 99 mg/dL  BASIC METABOLIC PANEL     Status: Abnormal   Collection Time    05/31/13  2:20 PM      Result Value Range   Sodium 136  135 - 145 mEq/L   Potassium 4.2  3.5 - 5.1 mEq/L   Chloride 101  96 - 112 mEq/L   CO2 27  19 - 32 mEq/L   Glucose, Bld 329 (*) 70 - 99 mg/dL   BUN 28 (*) 6 - 23 mg/dL   Creatinine, Ser 4.09  0.50 - 1.10 mg/dL   Calcium 8.9  8.4 - 81.1 mg/dL   GFR calc non Af Amer 61 (*) >90 mL/min   GFR calc Af Amer 70 (*) >90 mL/min   Comment: (NOTE)     The eGFR has been calculated using the CKD EPI equation.     This calculation has not been validated in all clinical situations.     eGFR's persistently <90 mL/min signify possible Chronic Kidney     Disease.  GLUCOSE, CAPILLARY     Status: Abnormal   Collection Time    05/31/13  4:32 PM      Result Value Range   Glucose-Capillary 276 (*) 70 - 99 mg/dL  GLUCOSE, CAPILLARY     Status: Abnormal   Collection Time    05/31/13  8:36 PM      Result Value Range   Glucose-Capillary 269 (*) 70 - 99 mg/dL   Comment 1 Notify RN    GLUCOSE, CAPILLARY     Status: Abnormal   Collection Time    06/01/13  7:36 AM      Result Value Range   Glucose-Capillary 215 (*) 70 - 99 mg/dL   Comment 1 Notify RN    GLUCOSE, CAPILLARY     Status:  Abnormal   Collection Time    06/01/13 12:13 PM      Result Value Range   Glucose-Capillary 187 (*) 70 - 99 mg/dL   Comment 1 Notify RN    GLUCOSE, CAPILLARY     Status: Abnormal   Collection Time    06/01/13  4:40 PM      Result Value Range   Glucose-Capillary 170 (*) 70 - 99 mg/dL   Comment 1 Notify RN    GLUCOSE, CAPILLARY     Status: Abnormal   Collection Time    06/01/13  8:54 PM      Result Value Range   Glucose-Capillary 251 (*) 70 - 99 mg/dL   Comment 1 Notify RN    GLUCOSE, CAPILLARY     Status: Abnormal   Collection Time    06/02/13  7:26 AM      Result Value Range   Glucose-Capillary 229 (*) 70 - 99 mg/dL   Comment 1 Notify RN  GLUCOSE, CAPILLARY     Status: Abnormal   Collection Time    06/02/13 11:25 AM      Result Value Range   Glucose-Capillary 359 (*) 70 - 99 mg/dL   Comment 1 Notify RN    GLUCOSE, CAPILLARY     Status: Abnormal   Collection Time    06/02/13  4:34 PM      Result Value Range   Glucose-Capillary 164 (*) 70 - 99 mg/dL  GLUCOSE, CAPILLARY     Status: Abnormal   Collection Time    06/02/13  8:53 PM      Result Value Range   Glucose-Capillary 224 (*) 70 - 99 mg/dL   Comment 1 Notify RN    GLUCOSE, CAPILLARY     Status: Abnormal   Collection Time    06/03/13  7:39 AM      Result Value Range   Glucose-Capillary 233 (*) 70 - 99 mg/dL     HEENT: normal Cardio: RRR and no murmur Resp: CTA B/L and unlabored GI: BS positive and non distended Extremity:  Pulses positive and No Edema Skin:   Intact Neuro: Alert/Oriented, Cranial Nerve Abnormalities R central 7,, Abnormal Sensory reduced sensory on Right side and Abnormal Motor 2-/5 in RUE, trace hip ext otherwise 0/5 RLE Musc/Skel: No pain with R wrist or shoulder ROM, no swelling or erythema Gen NAD   Assessment/Plan: 1. 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 Physiatrist is providing close team supervision and 24 hour management of active medical problems listed below. Physiatrist and rehab team continue to assess barriers to discharge/monitor patient progress toward functional and medical goals.  FIM: FIM - Bathing Bathing Steps Patient Completed: Chest;Right Arm;Abdomen;Right upper leg;Left upper leg;Left lower leg (including foot) Bathing: 3: Mod-Patient completes 5-7 18f 10 parts or 50-74%  FIM - Upper Body Dressing/Undressing Upper body dressing/undressing steps patient completed: Thread/unthread left sleeve of pullover shirt/dress;Put head through opening of pull over shirt/dress Upper body dressing/undressing: 3: Mod-Patient completed 50-74% of tasks FIM - Lower Body Dressing/Undressing Lower body dressing/undressing steps patient completed: Thread/unthread left pants leg Lower body dressing/undressing: 1: Total-Patient completed less than 25% of tasks  FIM - Toileting Toileting: 1: Two helpers  FIM - Diplomatic Services operational officer Devices: Elevated toilet seat Toilet Transfers: 3-To toilet/BSC: Mod A (lift or lower assist);3-From toilet/BSC: Mod A (lift or lower assist)  FIM - Bed/Chair Transfer Bed/Chair Transfer Assistive Devices: Arm rests Bed/Chair Transfer: 2: Supine > Sit: Max A (lifting assist/Pt. 25-49%);2: Bed > Chair or W/C: Max A (lift and lower assist)  FIM - Locomotion: Wheelchair Distance: Pt unable to attempt due to LEs not able to reach floor.  Locomotion: Wheelchair: 1: Total Assistance/staff pushes wheelchair (Pt<25%) FIM - Locomotion: Ambulation Locomotion: Ambulation Assistive Devices: Walker - Rolling (R handsplint) Ambulation/Gait Assistance: 1: +2 Total assist (mod/max with chair follow) Locomotion: Ambulation: 1: Two helpers  Comprehension Comprehension Mode: Auditory Comprehension: 3-Understands basic 50 - 74% of the time/requires cueing 25 - 50%  of  the time  Expression Expression Mode: Verbal Expression: 4-Expresses basic 75 - 89% of the time/requires cueing 10 - 24% of the time. Needs helper to occlude trach/needs to repeat words.  Social Interaction Social Interaction Mode: Asleep Social Interaction: 4-Interacts appropriately 75 - 89% of the time - Needs redirection for appropriate language or to initiate interaction.  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 DVT Prophylaxis/Anticoagulation: Pharmaceutical: Lovenox  2. Pain Management: Will continue prn tramadol for chronic RLE/right knee pain.  3. Mood: difficulty to ascertain with language as well as language barrier. Family to assist with input. LCSW to follow for evaluation. Appeared to be fairly up beat on examination today.  4. Neuropsych: This patient is not capable of making decisions on her own behalf.  5. HTN: Will monitor with bid checks. 6. DM type 2: amaryl and SSI Monitor BS with ac/hs checks and use SSI for elevated BS. Titrate as indicated for BS control. Poor control add metformin 500 mg twice a day 7. H/o angina: Will continue Ranexa. Negative cath reported this summer 8. Iron deficiency anemia: will add iron supplement. Check Vit B12 levels.  9.  Urinary retention and proteus UTI, started on Keflex for 7 days,on urecholine add flomax, starting to void, continue intermittent catheterization for post void residuals of greater than 300 mL     LOS (Days) 10 A FACE TO FACE EVALUATION WAS PERFORMED  Bernyce Brimley E 06/03/2013, 9:30 AM

## 2013-06-04 ENCOUNTER — Inpatient Hospital Stay (HOSPITAL_COMMUNITY): Payer: Medicaid Other | Admitting: Physical Therapy

## 2013-06-04 ENCOUNTER — Inpatient Hospital Stay (HOSPITAL_COMMUNITY): Payer: Medicaid Other | Admitting: Speech Pathology

## 2013-06-04 ENCOUNTER — Inpatient Hospital Stay (HOSPITAL_COMMUNITY): Payer: Medicaid Other | Admitting: *Deleted

## 2013-06-04 ENCOUNTER — Inpatient Hospital Stay (HOSPITAL_COMMUNITY): Payer: Medicaid Other | Admitting: Occupational Therapy

## 2013-06-04 LAB — GLUCOSE, CAPILLARY
Glucose-Capillary: 209 mg/dL — ABNORMAL HIGH (ref 70–99)
Glucose-Capillary: 171 mg/dL — ABNORMAL HIGH (ref 70–99)
Glucose-Capillary: 123 mg/dL — ABNORMAL HIGH (ref 70–99)

## 2013-06-04 NOTE — Progress Notes (Signed)
Subjective/Complaints: Interpreter at bedside. Granddaughter's asking about discharge date. No new issues per patient Voiding but also requiring some intermittent catheterization  Review of Systems - R shoulder pain improving,still needs int cath no other c/os per translator  Objective: Vital Signs: Blood pressure 122/70, pulse 66, temperature 97.4 F (36.3 C), temperature source Oral, resp. rate 17, weight 79.833 kg (176 lb), SpO2 96.00%. No results found. Results for orders placed during the hospital encounter of 05/24/13 (from the past 72 hour(s))  GLUCOSE, CAPILLARY     Status: Abnormal   Collection Time    06/01/13 12:13 PM      Result Value Range   Glucose-Capillary 187 (*) 70 - 99 mg/dL   Comment 1 Notify RN    GLUCOSE, CAPILLARY     Status: Abnormal   Collection Time    06/01/13  4:40 PM      Result Value Range   Glucose-Capillary 170 (*) 70 - 99 mg/dL   Comment 1 Notify RN    GLUCOSE, CAPILLARY     Status: Abnormal   Collection Time    06/01/13  8:54 PM      Result Value Range   Glucose-Capillary 251 (*) 70 - 99 mg/dL   Comment 1 Notify RN    GLUCOSE, CAPILLARY     Status: Abnormal   Collection Time    06/02/13  7:26 AM      Result Value Range   Glucose-Capillary 229 (*) 70 - 99 mg/dL   Comment 1 Notify RN    GLUCOSE, CAPILLARY     Status: Abnormal   Collection Time    06/02/13 11:25 AM      Result Value Range   Glucose-Capillary 359 (*) 70 - 99 mg/dL   Comment 1 Notify RN    GLUCOSE, CAPILLARY     Status: Abnormal   Collection Time    06/02/13  4:34 PM      Result Value Range   Glucose-Capillary 164 (*) 70 - 99 mg/dL  GLUCOSE, CAPILLARY     Status: Abnormal   Collection Time    06/02/13  8:53 PM      Result Value Range   Glucose-Capillary 224 (*) 70 - 99 mg/dL   Comment 1 Notify RN    GLUCOSE, CAPILLARY     Status: Abnormal   Collection Time    06/03/13  7:39 AM      Result Value Range   Glucose-Capillary 233 (*) 70 - 99 mg/dL  GLUCOSE, CAPILLARY      Status: Abnormal   Collection Time    06/03/13 12:00 PM      Result Value Range   Glucose-Capillary 267 (*) 70 - 99 mg/dL  GLUCOSE, CAPILLARY     Status: Abnormal   Collection Time    06/03/13  4:55 PM      Result Value Range   Glucose-Capillary 151 (*) 70 - 99 mg/dL  GLUCOSE, CAPILLARY     Status: None   Collection Time    06/03/13  8:17 PM      Result Value Range   Glucose-Capillary 93  70 - 99 mg/dL  GLUCOSE, CAPILLARY     Status: Abnormal   Collection Time    06/04/13  7:33 AM      Result Value Range   Glucose-Capillary 209 (*) 70 - 99 mg/dL     HEENT: normal Cardio: RRR and no murmur Resp: CTA B/L and unlabored GI: BS positive and non distended Extremity:  Pulses positive and  No Edema Skin:   Intact Neuro: Alert/Oriented, Cranial Nerve Abnormalities R central 7,, Abnormal Sensory reduced sensory on Right side and Abnormal Motor 2-/5 in RUE, trace hip ext otherwise 0/5 RLE Musc/Skel: No pain with R wrist or shoulder ROM, no swelling or erythema Gen NAD   Assessment/Plan: 1. 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 Physiatrist is providing close team supervision and 24 hour management of active medical problems listed below. Physiatrist and rehab team continue to assess barriers to discharge/monitor patient progress toward functional and medical goals.  FIM: FIM - Bathing Bathing Steps Patient Completed: Chest;Right Arm;Abdomen;Right upper leg;Left upper leg;Left lower leg (including foot) Bathing: 3: Mod-Patient completes 5-7 40f 10 parts or 50-74%  FIM - Upper Body Dressing/Undressing Upper body dressing/undressing steps patient completed: Thread/unthread left sleeve of pullover shirt/dress;Put head through opening of pull over shirt/dress Upper body dressing/undressing: 3: Mod-Patient completed 50-74% of  tasks FIM - Lower Body Dressing/Undressing Lower body dressing/undressing steps patient completed: Thread/unthread left pants leg Lower body dressing/undressing: 1: Total-Patient completed less than 25% of tasks  FIM - Toileting Toileting: 1: Two helpers  FIM - Diplomatic Services operational officer Devices: Elevated toilet seat Toilet Transfers: 3-To toilet/BSC: Mod A (lift or lower assist);3-From toilet/BSC: Mod A (lift or lower assist)  FIM - Bed/Chair Transfer Bed/Chair Transfer Assistive Devices: Arm rests Bed/Chair Transfer: 2: Supine > Sit: Max A (lifting assist/Pt. 25-49%);2: Bed > Chair or W/C: Max A (lift and lower assist)  FIM - Locomotion: Wheelchair Distance: Pt unable to attempt due to LEs not able to reach floor.  Locomotion: Wheelchair: 1: Total Assistance/staff pushes wheelchair (Pt<25%) FIM - Locomotion: Ambulation Locomotion: Ambulation Assistive Devices: Walker - Rolling (R handsplint) Ambulation/Gait Assistance: 1: +2 Total assist (mod/max with chair follow) Locomotion: Ambulation: 1: Two helpers  Comprehension Comprehension Mode: Auditory Comprehension: 3-Understands basic 50 - 74% of the time/requires cueing 25 - 50%  of the time  Expression Expression Mode: Verbal Expression: 4-Expresses basic 75 - 89% of the time/requires cueing 10 - 24% of the time. Needs helper to occlude trach/needs to repeat words.  Social Interaction Social Interaction Mode: Asleep Social Interaction: 4-Interacts appropriately 75 - 89% of the time - Needs redirection for appropriate language or to initiate interaction.  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 DVT  Prophylaxis/Anticoagulation: Pharmaceutical: Lovenox  2. Pain Management: Will continue prn tramadol for chronic RLE/right knee pain.  3. Mood: difficulty to ascertain with language as well as language barrier. Family to assist with input. LCSW to follow for evaluation. Affect is bright, granddaughter's and sister are visiting 4. Neuropsych: This patient is not capable of making decisions on her own behalf.  5. HTN: Will monitor with bid checks. 6. DM type 2: amaryl and SSI Monitor BS with ac/hs checks and use SSI for elevated BS. Titrate as indicated for BS control. Poor control add metformin 500 mg twice a day, monitor 7. H/o angina: Will continue Ranexa. Negative cath reported this summer 8. Iron deficiency anemia: will add iron supplement. Check Vit B12 levels.  9.  Urinary retention and proteus UTI, started on Keflex for 7 days,on urecholine and flomax, starting to void, continue intermittent catheterization for post void residuals of greater than 300 mL     LOS (Days) 11 A FACE TO FACE EVALUATION WAS PERFORMED  KIRSTEINS,ANDREW E 06/04/2013, 10:14 AM

## 2013-06-04 NOTE — Progress Notes (Signed)
Physical Therapy Session Note  Patient Details  Name: Shallon Yaklin MRN: [id number] Date of Birth: [birth year]  Today's Date: 06/04/2013 Time: 6644-0347 Time Calculation (min): 45 min  Short Term Goals: Week 2:  PT Short Term Goal 1 (Week 2): Pt will perform bed mobility at mod assist consistently with HOB flat PT Short Term Goal 2 (Week 2): Pt will perform squat pivot transfer at mod assist R or L with safe technique PT Short Term Goal 3 (Week 2): Pt will perform standing balance with mod assist of one person to perform functional activities PT Short Term Goal 4 (Week 2): Pt will ambulate x 20' w/ LRAD with max assist of one person  Skilled Therapeutic Interventions/Progress Updates:    Patient received sitting in wheelchair. Session focused on functional transfers, gait training, and stair negotiation; see details below. Patient performed x5 sit<>stand transfers from wheelchair to standing with RW. Patient maintains R hand on R hand orthosis of RW prior to standing and requires repeated verbal/visual/tactile cues for placement of L hand on arm rest of wheelchair to push up. Patient mod A progressing to min guard for sit<>stand transfers. Emphasis on slow, controlled descent for stand>sit to improve B eccentric quad control. Patient returned to room and left seated in wheelchair with all needs within reach.  Therapy Documentation Precautions:  Precautions Precautions: Fall Precaution Comments: dense R hemiplegia, pain in R wrist (maybe from fall), pain in R knee, expressive aphasia Restrictions Weight Bearing Restrictions: No Pain: Pain Assessment Pain Assessment: No/denies pain Pain Score: 0-No pain Locomotion : Ambulation Ambulation: Yes Ambulation/Gait Assistance: 1: +2 Total assist Ambulation Distance (Feet): 40 Feet Assistive device: Rolling walker (R hand orthosis) Ambulation/Gait Assistance Details: Verbal cues for precautions/safety;Verbal cues for gait  pattern;Manual facilitation for weight shifting;Manual facilitation for placement;Manual facilitation for weight bearing;Tactile cues for initiation;Tactile cues for posture Ambulation/Gait Assistance Details: Patient performed gait training 13' x1 with pediatric RW and R hand orthosis, modA for actual gait training, +2 for wheelchair follow. Requires manual facilitation for R weight shift and advancement and placement of R LE. Stabilization provided at R kne to prevent buckling, no instances of knee buckling noted. Gait Gait: Yes Gait Pattern: Impaired Gait Pattern: Decreased stance time - right;Decreased hip/knee flexion - right;Decreased dorsiflexion - right;Decreased weight shift to right;Trunk flexed;Narrow base of support;Right flexed knee in stance Stairs / Additional Locomotion Stairs: Yes Stairs Assistance: 2: Max Estate agent Assistance Details: Manual facilitation for placement;Manual facilitation for weight bearing;Manual facilitation for weight shifting;Verbal cues for gait pattern;Verbal cues for sequencing;Verbal cues for technique;Verbal cues for precautions/safety;Tactile cues for sequencing Stair Management Technique: One rail Right;Step to pattern;Sideways Number of Stairs: 2 Height of Stairs: 5 Wheelchair Mobility Wheelchair Mobility: No   See FIM for current functional status  Therapy/Group: Individual Therapy  Chipper Herb. Adan Baehr, PT, DPT 06/04/2013, 3:25 PM

## 2013-06-04 NOTE — Progress Notes (Signed)
Speech Language Pathology Daily Session Note  Patient Details  Name: Megyn Leng MRN: [id number] Date of Birth: 1940/11/22  Today's Date: 06/04/2013 Time: 4098-1191 Time Calculation (min): 43 min  Short Term Goals: Week 2: SLP Short Term Goal 1 (Week 2): Patient will tolerate regular textures and thin liquids with Min verbal cues to utilize safe swallow strategies. SLP Short Term Goal 2 (Week 2): Patient will verbalize basic wants/needs with Min questioning cues. SLP Short Term Goal 3 (Week 2): Patient will solve functional, complex problem solving in a understandable context with Mod verbal cues. SLP Short Term Goal 4 (Week 2): Patient will utilize external aids to recall basic, information with Mod verbal and questioning cues.  Skilled Therapeutic Interventions: Skilled treatment session focused on addressing cognitive-linguistic goals and family education.  Interpreter present for session as well as family that patient lived with; SLP facilitated session with functional basic money management task.  Patient counted cash, made change and addition calculations with less than $50 value with Supervision level question cues to self-monitor and correct errors.  Family present and confirmed that these tasks took the patient longer then before the CVA.  SLP also initiated family education regarding decreased safety awareness and need for 24/7 Supervision at discharge. Continue with current plan of care.   FIM:  Comprehension Comprehension Mode: Auditory Comprehension: 3-Understands basic 50 - 74% of the time/requires cueing 25 - 50%  of the time Expression Expression Mode: Verbal Expression: 4-Expresses basic 75 - 89% of the time/requires cueing 10 - 24% of the time. Needs helper to occlude trach/needs to repeat words. Social Interaction Social Interaction: 4-Interacts appropriately 75 - 89% of the time - Needs redirection for appropriate language or to initiate  interaction. Problem Solving Problem Solving: 4-Solves basic 75 - 89% of the time/requires cueing 10 - 24% of the time Memory Memory: 3-Recognizes or recalls 50 - 74% of the time/requires cueing 25 - 49% of the time FIM - Eating Eating Activity: 5: Needs verbal cues/supervision  Pain Pain Assessment Pain Assessment: No/denies pain  Therapy/Group: Individual Therapy  Charlane Ferretti., CCC-SLP [phone]  Monigue Spraggins 06/04/2013, 10:15 AM

## 2013-06-04 NOTE — Progress Notes (Signed)
At 2200 bladder scan-551, I & O cath=500cc's. 0200, no void, bladder scan-207.  0600 no void, I & O cath=450.Stacie [name] A

## 2013-06-04 NOTE — Progress Notes (Signed)
Physical Therapy Session Note  Patient Details  Name: Stacie [name] MRN: [id number] Date of Birth: 04/19/1941  Today's Date: 06/04/2013 Time: 1500-1550 Time Calculation (min): 50 min  Short Term Goals: Week 2:  PT Short Term Goal 1 (Week 2): Pt will perform bed mobility at mod assist consistently with HOB flat PT Short Term Goal 2 (Week 2): Pt will perform squat pivot transfer at mod assist R or L with safe technique PT Short Term Goal 3 (Week 2): Pt will perform standing balance with mod assist of one person to perform functional activities PT Short Term Goal 4 (Week 2): Pt will ambulate x 20' w/ LRAD with max assist of one person  Skilled Therapeutic Interventions/Progress Updates:    Pt received semi-reclined in bed; agreeable to therapy. Session focused on increasing pt stability/independence with functional mobility and gait. Performed supine<>sit with min A for RLE management. Low-pivot from bed<>w/c with modA, manual stabilization of RLE. Therapist transported pt in w/c to hallway. Per pt request, 3 family members were present during session. Gait x30' with pediatric rolling walker, R hand orthosis requiring modA overall, +2A for w/c follow. Pt able to actively advance RLE; requires manual facilitation at hamstrings (for ~50% of gait trial) and verbal cueing to increase RLE stride length with effective pt carryover. Manual stabilization of R knee during RLE stance phase of gait.  Pt transported in w/c to pt room, where RLE NMR was performed. See below for detailed description. Therapist departed with pt semi-reclined in bed with bed alarm on, 2 rails up, family members present, and all needs within reach.  Therapy Documentation Precautions:  Precautions Precautions: Fall Precaution Comments: dense R hemiplegia, pain in R wrist (maybe from fall), pain in R knee, expressive aphasia Restrictions Weight Bearing Restrictions: No General: Amount of Missed PT Time  (min): 10 Minutes Missed Time Reason: Patient fatigue Vital Signs: Therapy Vitals Temp: 97.7 F (36.5 C) Temp src: Oral Pulse Rate: 73 Resp: 18 BP: 122/54 mmHg Patient Position, if appropriate: Lying Oxygen Therapy SpO2: 94 % O2 Device: None (Room air) Pain: Pain Assessment Pain Assessment: No/denies pain Pain Score: 0-No pain Pain Type: Acute pain Pain Location: Knee Pain Orientation: Left Pain Descriptors / Indicators: Aching Pain Frequency: Intermittent Pain Onset: With Activity Patients Stated Pain Goal: 3 Pain Intervention(s): Medication (See eMAR) Other treatments: Supine: RLE PNF D2 flexion/extension: rhythmic initiation x10 reps per direction; 2x5 reps per direction with light manual resistance of hip/knee flexion/extension.   See FIM for current functional status  Therapy/Group: Individual Therapy  Calvert Cantor 06/04/2013, 4:22 PM

## 2013-06-04 NOTE — Progress Notes (Signed)
Occupational Therapy Session Note  Patient Details  Name: Stacie [name] MRN: [id number] Date of Birth: [birth year]  Today's Date: 06/04/2013 Time: 1000-1100 Time Calculation (min): 60 min  Short Term Goals: Week 2:  OT Short Term Goal 1 (Week 2): Pt will use the RUE as a stabilizer during selfcare tasks with supervision and min instructional cueing. OT Short Term Goal 2 (Week 2): Pt will perform LB bathing sit to stand with mod assist using AE PRN. OT Short Term Goal 3 (Week 2): Pt will perform LB dressing sit to stand to donn pullover pants with mod assist.  OT Short Term Goal 4 (Week 2): Pt will perform stand/squat pivot toilet transfer with mod assist to drop arm commode.  Skilled Therapeutic Interventions/Progress Updates:  Pt worked on bathing and dressing with sit to stand at the sink. Translator present during session and granddaughter as well as other family present to observe as well. Pt able to use the RUE as an active assist for washing the left arm and part of her stomach with min assist. Incorporated it as a stabilizer for holding the washcloth while applying soap with min assist level. Pt able to perform 3/4 steps to donn shirt. Patient put in right hand with vcs then left arm and when she pulled shirt up over head her right hand slipped out yet she kept on going.  Patient able to put right arm in shirt after left arm and head completed!  Integrated reacher for removing socks donning pants over the right and left LE with max assist. Supervision-Min assist needed for static sit to stand and standing balance with bilateral UE support. Increases to needing Min-Mod assist for standing if she releases the surface and attempts to use the LUE to pull her pants over her hips. Total assist needed for donning shoes and socks.  Education provided to patient and family throughout session.   Therapy Documentation Precautions:  Precautions Precautions: Fall Precaution  Comments: dense R hemiplegia, pain in R wrist (maybe from fall), pain in R knee, expressive aphasia Restrictions Weight Bearing Restrictions: No Pain: Indicates RLE pain with attempts to assist with tailor sit during LB dressing, repositioned. ADL: See FIM for current functional status  Therapy/Group: Individual Therapy  [name] 06/04/2013, 11:42 AM

## 2013-06-04 NOTE — Progress Notes (Signed)
Social Work Patient ID: Stacie [name], female   DOB: 03/31/1941, 72 y.o.   MRN: [id number] Stacie [name] here to attend therapies with pt.  Questions seem to be around pt's voiding issue and is she will need to be cathed at home.  MD is hopeful she will not. They continue to come up with a plan for her discharge and are aware she will need 24 hour care.  She has done well and hopefully will continue to progress.

## 2013-06-05 DIAGNOSIS — I6330 Cerebral infarction due to thrombosis of unspecified cerebral artery: Secondary | ICD-10-CM

## 2013-06-05 DIAGNOSIS — E1165 Type 2 diabetes mellitus with hyperglycemia: Secondary | ICD-10-CM

## 2013-06-05 DIAGNOSIS — G8110 Spastic hemiplegia affecting unspecified side: Secondary | ICD-10-CM

## 2013-06-05 LAB — GLUCOSE, CAPILLARY
Glucose-Capillary: 202 mg/dL — ABNORMAL HIGH (ref 70–99)
Glucose-Capillary: 228 mg/dL — ABNORMAL HIGH (ref 70–99)

## 2013-06-05 NOTE — Progress Notes (Signed)
Subjective/Complaints:  Granddaughter slept in room last night. No problems noted.. Starting to void but also requiring some intermittent catheterization  Review of Systems - R shoulder pain improving,still needs int cath no other c/os per translator  Objective: Vital Signs: Blood pressure 124/49, pulse 68, temperature 98 F (36.7 C), temperature source Oral, resp. rate 18, weight 79.833 kg (176 lb), SpO2 97.00%. No results found. Results for orders placed during the hospital encounter of 05/24/13 (from the past 72 hour(s))  GLUCOSE, CAPILLARY     Status: Abnormal   Collection Time    06/02/13 11:25 AM      Result Value Range   Glucose-Capillary 359 (*) 70 - 99 mg/dL   Comment 1 Notify RN    GLUCOSE, CAPILLARY     Status: Abnormal   Collection Time    06/02/13  4:34 PM      Result Value Range   Glucose-Capillary 164 (*) 70 - 99 mg/dL  GLUCOSE, CAPILLARY     Status: Abnormal   Collection Time    06/02/13  8:53 PM      Result Value Range   Glucose-Capillary 224 (*) 70 - 99 mg/dL   Comment 1 Notify RN    GLUCOSE, CAPILLARY     Status: Abnormal   Collection Time    06/03/13  7:39 AM      Result Value Range   Glucose-Capillary 233 (*) 70 - 99 mg/dL  GLUCOSE, CAPILLARY     Status: Abnormal   Collection Time    06/03/13 12:00 PM      Result Value Range   Glucose-Capillary 267 (*) 70 - 99 mg/dL  GLUCOSE, CAPILLARY     Status: Abnormal   Collection Time    06/03/13  4:55 PM      Result Value Range   Glucose-Capillary 151 (*) 70 - 99 mg/dL  GLUCOSE, CAPILLARY     Status: None   Collection Time    06/03/13  8:17 PM      Result Value Range   Glucose-Capillary 93  70 - 99 mg/dL  GLUCOSE, CAPILLARY     Status: Abnormal   Collection Time    06/04/13  7:33 AM      Result Value Range   Glucose-Capillary 209 (*) 70 - 99 mg/dL  GLUCOSE, CAPILLARY     Status: Abnormal   Collection Time    06/04/13 11:33 AM      Result Value Range   Glucose-Capillary 171 (*) 70 - 99 mg/dL   GLUCOSE, CAPILLARY     Status: Abnormal   Collection Time    06/04/13  4:56 PM      Result Value Range   Glucose-Capillary 123 (*) 70 - 99 mg/dL  GLUCOSE, CAPILLARY     Status: Abnormal   Collection Time    06/04/13  8:48 PM      Result Value Range   Glucose-Capillary 201 (*) 70 - 99 mg/dL  GLUCOSE, CAPILLARY     Status: Abnormal   Collection Time    06/05/13  7:40 AM      Result Value Range   Glucose-Capillary 202 (*) 70 - 99 mg/dL     HEENT: normal Cardio: RRR and no murmur Resp: CTA B/L and unlabored GI: BS positive and non distended Extremity:  Pulses positive and No Edema Skin:   Intact Neuro: Alert/Oriented, Cranial Nerve Abnormalities R central 7,, Abnormal Sensory reduced sensory on Right side and Abnormal Motor 3-/5 in RUE, 3 minus/5 right hip flexor knee  extensor 2 minus/5 ankle dorsiflexor and plantar flexed 5/5 in the left upper and left lower limb Musc/Skel: No pain with R wrist or shoulder ROM, no swelling or erythema Gen NAD   Assessment/Plan: 1. 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 Physiatrist is providing close team supervision and 24 hour management of active medical problems listed below. Physiatrist and rehab team continue to assess barriers to discharge/monitor patient progress toward functional and medical goals.  FIM: FIM - Bathing Bathing Steps Patient Completed: Chest;Right Arm;Abdomen;Right upper leg;Left upper leg;Left lower leg (including foot) Bathing: 3: Mod-Patient completes 5-7 49f 10 parts or 50-74%  FIM - Upper Body Dressing/Undressing Upper body dressing/undressing steps patient completed: Thread/unthread left sleeve of pullover shirt/dress;Put head through opening of pull over shirt/dress;Thread/unthread right sleeve of pullover shirt/dresss Upper body dressing/undressing: 4:  Min-Patient completed 75 plus % of tasks FIM - Lower Body Dressing/Undressing Lower body dressing/undressing steps patient completed: Thread/unthread left pants leg;Don/Doff left shoe Lower body dressing/undressing: 1: Total-Patient completed less than 25% of tasks  FIM - Toileting Toileting: 1: Two helpers  FIM - Diplomatic Services operational officer Devices: Elevated toilet seat Toilet Transfers: 3-To toilet/BSC: Mod A (lift or lower assist);3-From toilet/BSC: Mod A (lift or lower assist)  FIM - Bed/Chair Transfer Bed/Chair Transfer Assistive Devices: Bed rails;Arm rests (R hand orthosis) Bed/Chair Transfer: 3: Bed > Chair or W/C: Mod A (lift or lower assist);3: Chair or W/C > Bed: Mod A (lift or lower assist);4: Supine > Sit: Min A (steadying Pt. > 75%/lift 1 leg);4: Sit > Supine: Min A (steadying pt. > 75%/lift 1 leg)  FIM - Locomotion: Wheelchair Distance: Pt unable to attempt due to LEs not able to reach floor.  Locomotion: Wheelchair: 1: Total Assistance/staff pushes wheelchair (Pt<25%) FIM - Locomotion: Ambulation Locomotion: Ambulation Assistive Devices: Walker - Rolling;Orthosis Ambulation/Gait Assistance: 1: +2 Total assist Locomotion: Ambulation: 1: Two helpers  Comprehension Comprehension Mode: Auditory Comprehension: 3-Understands basic 50 - 74% of the time/requires cueing 25 - 50%  of the time  Expression Expression Mode: Verbal Expression: 4-Expresses basic 75 - 89% of the time/requires cueing 10 - 24% of the time. Needs helper to occlude trach/needs to repeat words.  Social Interaction Social Interaction Mode: Asleep Social Interaction: 4-Interacts appropriately 75 - 89% of the time - Needs redirection for appropriate language or to initiate interaction.  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 DVT Prophylaxis/Anticoagulation: Pharmaceutical: Lovenox  2. Pain Management: Will continue prn tramadol for chronic RLE/right knee pain.  3. Mood: difficulty to ascertain with language as well as language barrier. Family to assist with input. LCSW to follow for evaluation. Affect is bright, granddaughter's and sister are visiting 4. Neuropsych: This patient is not capable of making decisions on her own behalf.  5. HTN: Will monitor with bid checks. 6. DM type 2: amaryl and SSI Monitor BS with ac/hs checks and use SSI for elevated BS. Titrate as indicated for BS control. Poor control add metformin 500 mg twice a day, monitor 7. H/o angina: Will continue Ranexa. Negative cath reported this  summer 8. Iron deficiency anemia: will add iron supplement. Check Vit B12 levels.  9.  Urinary retention and proteus UTI, started on Keflex for 7 days,on urecholine and flomax, starting to void, continue intermittent catheterization for post void residuals of greater than 300 mL     LOS (Days) 12 A FACE TO FACE EVALUATION WAS PERFORMED  KIRSTEINS,ANDREW E 06/05/2013, 10:14 AM

## 2013-06-06 ENCOUNTER — Inpatient Hospital Stay (HOSPITAL_COMMUNITY): Payer: Medicaid Other | Admitting: *Deleted

## 2013-06-06 ENCOUNTER — Inpatient Hospital Stay (HOSPITAL_COMMUNITY): Payer: Medicaid Other | Admitting: Physical Therapy

## 2013-06-06 LAB — GLUCOSE, CAPILLARY
Glucose-Capillary: 166 mg/dL — ABNORMAL HIGH (ref 70–99)
Glucose-Capillary: 135 mg/dL — ABNORMAL HIGH (ref 70–99)
Glucose-Capillary: 103 mg/dL — ABNORMAL HIGH (ref 70–99)

## 2013-06-06 MED ORDER — METFORMIN HCL 850 MG PO TABS
850.0000 mg | ORAL_TABLET | Freq: Two times a day (BID) | ORAL | Status: DC
  Administered 2013-06-06 – 2013-06-16 (×21): 850 mg via ORAL
  Filled 2013-06-06 (×23): qty 1

## 2013-06-06 NOTE — Progress Notes (Signed)
Subjective/Complaints: No translator today.  Pt is smiling No problems noted.. Starting to void but also requiring some intermittent catheterization  Review of Systems - R shoulder pain improving,still needs int cath no other c/os per translator  Objective: Vital Signs: Blood pressure 126/68, pulse 71, temperature 97.5 F (36.4 C), temperature source Oral, resp. rate 19, weight 79.833 kg (176 lb), SpO2 96.00%. No results found. Results for orders placed during the hospital encounter of 05/24/13 (from the past 72 hour(s))  GLUCOSE, CAPILLARY     Status: Abnormal   Collection Time    06/03/13  7:39 AM      Result Value Range   Glucose-Capillary 233 (*) 70 - 99 mg/dL  GLUCOSE, CAPILLARY     Status: Abnormal   Collection Time    06/03/13 12:00 PM      Result Value Range   Glucose-Capillary 267 (*) 70 - 99 mg/dL  GLUCOSE, CAPILLARY     Status: Abnormal   Collection Time    06/03/13  4:55 PM      Result Value Range   Glucose-Capillary 151 (*) 70 - 99 mg/dL  GLUCOSE, CAPILLARY     Status: None   Collection Time    06/03/13  8:17 PM      Result Value Range   Glucose-Capillary 93  70 - 99 mg/dL  GLUCOSE, CAPILLARY     Status: Abnormal   Collection Time    06/04/13  7:33 AM      Result Value Range   Glucose-Capillary 209 (*) 70 - 99 mg/dL  GLUCOSE, CAPILLARY     Status: Abnormal   Collection Time    06/04/13 11:33 AM      Result Value Range   Glucose-Capillary 171 (*) 70 - 99 mg/dL  GLUCOSE, CAPILLARY     Status: Abnormal   Collection Time    06/04/13  4:56 PM      Result Value Range   Glucose-Capillary 123 (*) 70 - 99 mg/dL  GLUCOSE, CAPILLARY     Status: Abnormal   Collection Time    06/04/13  8:48 PM      Result Value Range   Glucose-Capillary 201 (*) 70 - 99 mg/dL  GLUCOSE, CAPILLARY     Status: Abnormal   Collection Time    06/05/13  7:40 AM      Result Value Range   Glucose-Capillary 202 (*) 70 - 99 mg/dL  GLUCOSE, CAPILLARY     Status: Abnormal   Collection Time     06/05/13 11:14 AM      Result Value Range   Glucose-Capillary 202 (*) 70 - 99 mg/dL  GLUCOSE, CAPILLARY     Status: Abnormal   Collection Time    06/05/13  3:48 PM      Result Value Range   Glucose-Capillary 110 (*) 70 - 99 mg/dL  GLUCOSE, CAPILLARY     Status: Abnormal   Collection Time    06/05/13  9:10 PM      Result Value Range   Glucose-Capillary 228 (*) 70 - 99 mg/dL     HEENT: normal Cardio: RRR and no murmur Resp: CTA B/L and unlabored GI: BS positive and non distended Extremity:  Pulses positive and No Edema Skin:   Intact Neuro: Alert/Oriented, Cranial Nerve Abnormalities R central 7,, Abnormal Sensory reduced sensory on Right side and Abnormal Motor 3-/5 in RUE, 3 minus/5 right hip flexor knee extensor 2 minus/5 ankle dorsiflexor and plantar flexed 5/5 in the left upper and left lower  limb Musc/Skel: No pain with R wrist or shoulder ROM, no swelling or erythema Gen NAD   Assessment/Plan: 1. 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 Physiatrist is providing close team supervision and 24 hour management of active medical problems listed below. Physiatrist and rehab team continue to assess barriers to discharge/monitor patient progress toward functional and medical goals.  FIM: FIM - Bathing Bathing Steps Patient Completed: Chest;Right Arm;Abdomen;Right upper leg;Left upper leg;Left lower leg (including foot) Bathing: 3: Mod-Patient completes 5-7 85f 10 parts or 50-74%  FIM - Upper Body Dressing/Undressing Upper body dressing/undressing steps patient completed: Thread/unthread left sleeve of pullover shirt/dress;Put head through opening of pull over shirt/dress;Thread/unthread right sleeve of pullover shirt/dresss Upper body dressing/undressing: 4: Min-Patient completed 75 plus % of tasks FIM - Lower Body  Dressing/Undressing Lower body dressing/undressing steps patient completed: Thread/unthread left pants leg;Don/Doff left shoe Lower body dressing/undressing: 1: Total-Patient completed less than 25% of tasks  FIM - Toileting Toileting: 1: Two helpers  FIM - Diplomatic Services operational officer Devices: Elevated toilet seat Toilet Transfers: 3-To toilet/BSC: Mod A (lift or lower assist);3-From toilet/BSC: Mod A (lift or lower assist)  FIM - Bed/Chair Transfer Bed/Chair Transfer Assistive Devices: Bed rails;Arm rests (R hand orthosis) Bed/Chair Transfer: 3: Bed > Chair or W/C: Mod A (lift or lower assist);3: Chair or W/C > Bed: Mod A (lift or lower assist);4: Supine > Sit: Min A (steadying Pt. > 75%/lift 1 leg);4: Sit > Supine: Min A (steadying pt. > 75%/lift 1 leg)  FIM - Locomotion: Wheelchair Distance: Pt unable to attempt due to LEs not able to reach floor.  Locomotion: Wheelchair: 1: Total Assistance/staff pushes wheelchair (Pt<25%) FIM - Locomotion: Ambulation Locomotion: Ambulation Assistive Devices: Walker - Rolling;Orthosis Ambulation/Gait Assistance: 1: +2 Total assist Locomotion: Ambulation: 1: Two helpers  Comprehension Comprehension Mode: Auditory Comprehension: 3-Understands basic 50 - 74% of the time/requires cueing 25 - 50%  of the time  Expression Expression Mode: Verbal Expression: 4-Expresses basic 75 - 89% of the time/requires cueing 10 - 24% of the time. Needs helper to occlude trach/needs to repeat words.  Social Interaction Social Interaction Mode: Asleep Social Interaction: 4-Interacts appropriately 75 - 89% of the time - Needs redirection for appropriate language or to initiate interaction.  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 DVT Prophylaxis/Anticoagulation: Pharmaceutical: Lovenox  2. Pain Management: Will continue prn tramadol for chronic RLE/right knee pain.  3. Mood: difficulty to ascertain with language as well as language barrier. Family to assist with input. LCSW to follow for evaluation. Affect is bright,  4. Neuropsych: This patient is not capable of making decisions on her own behalf.  5. HTN: Will monitor with bid checks. 6. DM type 2: amaryl and SSI Monitor BS with ac/hs checks and use SSI for elevated BS. Titrate as indicated for BS control. Poor control increase metformin 850 mg twice a day, monitor 7. H/o angina: Will continue Ranexa. Negative cath reported this summer 8. Iron deficiency anemia: will add iron supplement. Check Vit B12 levels.  9.  Urinary retention and proteus  UTI, started on Keflex for 7 days,on urecholine and flomax, starting to void, continue intermittent catheterization for post void residuals of greater than 300 mL     LOS (Days) 13 A FACE TO FACE EVALUATION WAS PERFORMED  Drayce Tawil E 06/06/2013, 7:08 AM

## 2013-06-06 NOTE — Progress Notes (Signed)
I & O cath  At 0415=350cc's.Stacie [name] A

## 2013-06-06 NOTE — Progress Notes (Signed)
Physical Therapy Note  Patient Details  Name: Stacie [name] MRN: [id number] Date of Birth: 03-25-1941 Today's Date: 06/06/2013  1100-1155 ( 55 minutes) individual Pain: no reported pain Focus of treatment: Gait training; therapeutic exercise focused on activity tolerance; Neuro re-ed RT LE Treatment: Pt up in wc upon arrival; transfers stand /turn min assist ; sit to supine (mat) SBA ; supine to side to sit mod assist RT LE/trunk; Neuro re-ed Rt LE- sidelying (gravity eliminated) hip flexion/extension, knee flexion/extension ( improving isolated active movement noted for each movement); gait 80 feet X 2 RW with hand splint on right + ace wrap RT ankle min assist to mod assist for balance during direction changes (decreased BOS); Nustep Level 4 x 10 minutes with 2 rest breaks; pt returned to room with quick release belt in place. (interpreter present)   1405 -1435 (30 minutes) individual Pain: 8/10 back pain / pain meds given Focus of treatment: gait training Treatment: Pt up in wc upon arrival; interpreter present; gait training using RW + hand splint right min assist side to side, forwards and backwards with mod vcs to increase step length on right stepping backward to prevent loss of balance ( X 1); pt has difficulty adducting and abducting RT LE. ; pt now able to advance right foot without ace wrap with minimal foot drag;  returned to room with family present.    [name],JIM 06/06/2013, 12:47 PM

## 2013-06-06 NOTE — Progress Notes (Signed)
Occupational Therapy Note   Patient Details  Name: Stacie [name] MRN: [id number] Date of Birth: 1940-12-10 Today's Date: 06/06/2013  Time:  1300-1345  (45 min) Pain:  10/10 lumbar region;  Pt declined any meds.  Able to work entire session Individual session  Pt.sitting in wc.  Went to gym and engaged in [employer] with various activities (ring toss, pegs, cups, half circle with rings).  Pt had difficulty with shoulder flexion and abduction.  Did show signs of fatigue after 4 or 5 repetitions.  Pt taken back to room and left with translator and family.     Humberto Seals 06/06/2013, 9:02 AM

## 2013-06-06 NOTE — Progress Notes (Signed)
Occupational Therapy Note Patient Details  Name: Stacie [name] MRN: [id number] Date of Birth: 1940-07-10 Today's Date: 06/06/2013  Engaged in transfers, bed mobility, RUE NMRE.  Pt. Transferred from bed to wc with mod assist and then to shower bench.  Pt stood and pivoted to bench.  Pt. Used RUE as active assist to wash  Right arm and leg.  Pt. Performed static standing balance in shower with minimal assist.  Pt able to transition to more dynamic balance by washing peri area with LUE.  Pt dressed in regular chair due to short statue.  Would recommend EOB on future treatment because this is how she did at home.  Left pt in wc with safety belt on and call bell,phone within reach.      Humberto Seals 06/06/2013, 8:33 AM

## 2013-06-06 NOTE — Progress Notes (Signed)
At 2230 incontinent of urine, [id number]. At 0445 bladder scan after no void=356. Foam dressing to sacrum. Stacie [name] A

## 2013-06-07 ENCOUNTER — Inpatient Hospital Stay (HOSPITAL_COMMUNITY): Payer: Medicaid Other | Admitting: Speech Pathology

## 2013-06-07 ENCOUNTER — Inpatient Hospital Stay (HOSPITAL_COMMUNITY): Payer: Self-pay | Admitting: Rehabilitation

## 2013-06-07 ENCOUNTER — Inpatient Hospital Stay (HOSPITAL_COMMUNITY): Payer: Medicaid Other | Admitting: Occupational Therapy

## 2013-06-07 DIAGNOSIS — G8110 Spastic hemiplegia affecting unspecified side: Secondary | ICD-10-CM

## 2013-06-07 DIAGNOSIS — I6330 Cerebral infarction due to thrombosis of unspecified cerebral artery: Secondary | ICD-10-CM

## 2013-06-07 DIAGNOSIS — E1165 Type 2 diabetes mellitus with hyperglycemia: Secondary | ICD-10-CM

## 2013-06-07 LAB — GLUCOSE, CAPILLARY
Glucose-Capillary: 150 mg/dL — ABNORMAL HIGH (ref 70–99)
Glucose-Capillary: 68 mg/dL — ABNORMAL LOW (ref 70–99)

## 2013-06-07 LAB — BASIC METABOLIC PANEL
BUN: 19 mg/dL (ref 6–23)
CO2: 27 mEq/L (ref 19–32)
Calcium: 9.2 mg/dL (ref 8.4–10.5)
Creatinine, Ser: 0.74 mg/dL (ref 0.50–1.10)

## 2013-06-07 NOTE — Progress Notes (Signed)
Physical Therapy Session Note  Patient Details  Name: Stacie [name] MRN: [id number] Date of Birth: 04/24/1941  Today's Date: 06/07/2013 Time: 1324-4010 Time Calculation (min): 54 min  Short Term Goals: Week 2:  PT Short Term Goal 1 (Week 2): Pt will perform bed mobility at mod assist consistently with HOB flat PT Short Term Goal 2 (Week 2): Pt will perform squat pivot transfer at mod assist R or L with safe technique PT Short Term Goal 3 (Week 2): Pt will perform standing balance with mod assist of one person to perform functional activities PT Short Term Goal 4 (Week 2): Pt will ambulate x 20' w/ LRAD with max assist of one person  Skilled Therapeutic Interventions/Progress Updates:   Pt received sitting in w/c and agreeable to therapy.  Assisted to gym to participate in gait training with [facility]. Also performed 50' of gait training without AD for increased WB through LEs with decreased UE support at min assist (faced pt anteriorly during this task) with cues and demonstration for increased RLE step length.  See full details below for [facility] gait training.  Also performed 5 reps sit <> stand with small block under LLE to increase WB through RLE.  Also provided manual facilitation to the R when standing to increase WB and she was able to perform consistently at min assist to min/guard assist for facilitation.  Performed bed mobility in ADL apt to better simulate home environment at min assist to elevate and lower RLE into/out of bed.  Also performed dynamic balance activity with gait for increased NMR through RLE.  See full details below.  Assisted back to room and left in w/c with all needs in reach.    Therapy Documentation Precautions:  Precautions Precautions: Fall Precaution Comments: dense R hemiplegia, pain in R wrist (maybe from fall), pain in R knee, expressive aphasia Restrictions Weight Bearing Restrictions: No   Pain:Pt with some c/o pain in L knee during gait.   Allowed time for rest breaks in between tasks.    Locomotion : Ambulation Ambulation: Yes Ambulation/Gait Assistance: 4: Min assist Ambulation Distance (Feet): 90 Feet (x 2 reps, then another 50') Assistive device: Large base quad cane Ambulation/Gait Assistance Details: Verbal cues for precautions/safety;Verbal cues for gait pattern;Manual facilitation for weight shifting;Manual facilitation for placement;Manual facilitation for weight bearing;Tactile cues for initiation;Tactile cues for posture Ambulation/Gait Assistance Details: Initiated gait training with [facility] at min assist level (mod assist at one time for LOB) at distances mentioned above.  Provided min tactile and manual facilitation for weight shift L for increased foot clearance, esp on carpeted surface and also tactile cues at back of R LE for increased step length.  Provided verbal and demonstration cues for correct technique for sequencing quad cane during gait training.   Gait Gait: Yes Gait Pattern: Impaired Gait Pattern: Decreased stance time - right;Decreased hip/knee flexion - right;Decreased dorsiflexion - right;Decreased weight shift to right;Right flexed knee in stance High Level Ambulation High Level Ambulation: Side stepping Side Stepping: Performed side stepping x 20' for increased weight shifting R and L, increased WB through RLE, and hip abd strength during activity.  Provided hand held support anterior to pt during side stepping.  Stairs / Additional Locomotion Stairs: Yes Stairs Assistance: 3: Mod assist Stairs Assistance Details: Manual facilitation for placement;Manual facilitation for weight bearing;Manual facilitation for weight shifting;Verbal cues for gait pattern;Verbal cues for sequencing;Verbal cues for technique;Verbal cues for precautions/safety;Tactile cues for sequencing Stairs Assistance Details (indicate cue type and  reason): Performed stair training with R handrail and use of quad cane.  Provided  verbal and demonstration cues for correct technique.  Also provided manual facilitation for weight bearing L for increased R LE clearance.  also provided assist to elevate RLE at times.   Stair Management Technique: One rail Right Number of Stairs: 3 Height of Stairs: 4    Other Treatments: Treatments Therapeutic Activity: Performed gait training with LBQC while kicking box in order to increase weight shift R and L, esp R for increased weight bearing through RLE, to increase hip flex strength and work on simulated single limb stance during activity.  Requires min assist throughout to steady with tactile cues for increased use of LLE to kick.    Neuromuscular Facilitation: Right;Left;Lower Extremity;Activity to increase motor control;Activity to increase timing and sequencing;Activity to increase coordination;Activity to increase sustained activation;Activity to increase lateral weight shifting;Forced use  See FIM for current functional status  Therapy/Group: Individual Therapy  Vista Deck 06/07/2013, 4:29 PM

## 2013-06-07 NOTE — Significant Event (Signed)
Hypoglycemic Event  CBG: 68  Treatment: 15 GM carbohydrate snack  Symptoms: None  Follow-up CBG: Time:81 CBG Result:2058  Possible Reasons for Event: Unknown  Comments/MD notified:Patient finishing carbohydrate snack, will continue to monitor.    [name], Stacie Beets G  Remember to initiate Hypoglycemia Order Set & complete

## 2013-06-07 NOTE — Progress Notes (Signed)
Speech Language Pathology Daily Session Note  Patient Details  Name: Stacie [name] MRN: [id number] Date of Birth: 1941/05/14  Today's Date: 06/07/2013 Time: 0935-1020 Time Calculation (min): 45 min  Short Term Goals: Week 2: SLP Short Term Goal 1 (Week 2): Patient will tolerate regular textures and thin liquids with Min verbal cues to utilize safe swallow strategies. SLP Short Term Goal 2 (Week 2): Patient will verbalize basic wants/needs with Min questioning cues. SLP Short Term Goal 3 (Week 2): Patient will solve functional, complex problem solving in a understandable context with Mod verbal cues. SLP Short Term Goal 4 (Week 2): Patient will utilize external aids to recall basic, information with Mod verbal and questioning cues.  Skilled Therapeutic Interventions: Skilled treatment session focused on addressing cognitive-linguistic goals.  Interpreter present for session; SLP facilitated session with medication management task.  Patient reported taking only blood sugar medication prior to admission.  SLP and interpreter implemented external aids in native language to assist with recall and carryover of names of mediations and reason for taking them.  Patient initially required Max assist to effectively use; however, SLP was able to faded cues to Mod assist.  Patient with limited awareness of difficulty and reported that she can do it on her own; SLP reinforced that someone would be there 24/7 to ensure accuracy.   RN present to administer medication whole with thin via cup and patient demonstrated no overt s/s of aspiration; as a result it is recommended that she be upgraded to take medications this way and also receive set-up with intermittent supervision during her meals.       FIM:  Comprehension Comprehension Mode: Auditory Comprehension: 5-Understands basic 90% of the time/requires cueing < 10% of the time Expression Expression Mode: Verbal Expression: 4-Expresses  basic 75 - 89% of the time/requires cueing 10 - 24% of the time. Needs helper to occlude trach/needs to repeat words. Social Interaction Social Interaction: 4-Interacts appropriately 75 - 89% of the time - Needs redirection for appropriate language or to initiate interaction. Problem Solving Problem Solving: 4-Solves basic 75 - 89% of the time/requires cueing 10 - 24% of the time Memory Memory: 2-Recognizes or recalls 25 - 49% of the time/requires cueing 51 - 75% of the time FIM - Eating Eating Activity: 5: Set-up assist for open containers  Pain Pain Assessment Pain Assessment: No/denies pain  Therapy/Group: Individual Therapy  Charlane Ferretti., CCC-SLP [phone]  Faith Patricelli 06/07/2013, 10:53 AM

## 2013-06-07 NOTE — Progress Notes (Signed)
Physical Therapy Session Note  Patient Details  Name: Stacie [name] MRN: [id number] Date of Birth: September 18, 1940  Today's Date: 06/07/2013 Time: 0830-0900 Time Calculation (min): 30 min  Short Term Goals: Week 2:  PT Short Term Goal 1 (Week 2): Pt will perform bed mobility at mod assist consistently with HOB flat PT Short Term Goal 2 (Week 2): Pt will perform squat pivot transfer at mod assist R or L with safe technique PT Short Term Goal 3 (Week 2): Pt will perform standing balance with mod assist of one person to perform functional activities PT Short Term Goal 4 (Week 2): Pt will ambulate x 20' w/ LRAD with max assist of one person  Skilled Therapeutic Interventions/Progress Updates:   Pt received sitting on commode in restroom with nurse tech present to assist.  Pt performed stand/squat pivot from toilet to w/c at min assist with BLEs supported on small block.  Provided min manual cues for increased weight shift forward for increased buttock clearance.  Performed another sit <> stand x 2 reps at min assist each for adjusting brief prior to assisting to hallway for gait training.  Performed gait training x 90' x 1 with RW and R hand splint at min assist.  She was able to advance RLE very well during gait and did not require ace wrap for DF assist.  Provided cues for increased step length on RLE, upright posture throughout, maintaining position inside of RW and safety and technique when turning with RW in order to sit on mat in ortho gym.  Continue to note some difficulty with RLE extension when backing up.  Performed another stand pivot transfer with RW mat > w/c at min assist for minor steadying and some assist with RW to keep it with her until all the way at seating surface.  Pt left in room in w/c without quick release belt on.  Educated pt on trialing rest breaks in between therapies without quick release belt to determine safety with using call bell when needed for nursing  assist when getting out of chair.  Left call bell in reach and interpreter present during session and in room at end of session.   Therapy Documentation Precautions:  Precautions Precautions: Fall Precaution Comments: dense R hemiplegia, pain in R wrist (maybe from fall), pain in R knee, expressive aphasia Restrictions Weight Bearing Restrictions: No   Pain: Pain Assessment Pain Assessment: No/denies pain   Locomotion : Ambulation Ambulation/Gait Assistance: 4: Min assist   See FIM for current functional status  Therapy/Group: Individual Therapy  Vista Deck 06/07/2013, 9:56 AM

## 2013-06-07 NOTE — Progress Notes (Signed)
At 2130, no void, bladder scan=565, I & O cath-525.  RUE splint applied at that time. At 0210 incontinent of urine, bladder scan=30. At 0510, no void, bladder scan < . Foam dressing to buttocks. Alfredo Martinez A

## 2013-06-07 NOTE — Progress Notes (Signed)
Subjective/Complaints: No translator today.  Pt is smiling No problems noted.. Starting to void but also requiring some intermittent catheterization   Review of Systems - R shoulder pain   Objective: Vital Signs: Blood pressure 109/67, pulse 63, temperature 98.1 F (36.7 C), temperature source Oral, resp. rate 17, weight 79.833 kg (176 lb), SpO2 98.00%. No results found. Results for orders placed during the hospital encounter of 05/24/13 (from the past 72 hour(s))  GLUCOSE, CAPILLARY     Status: Abnormal   Collection Time    06/04/13 11:33 AM      Result Value Range   Glucose-Capillary 171 (*) 70 - 99 mg/dL  GLUCOSE, CAPILLARY     Status: Abnormal   Collection Time    06/04/13  4:56 PM      Result Value Range   Glucose-Capillary 123 (*) 70 - 99 mg/dL  GLUCOSE, CAPILLARY     Status: Abnormal   Collection Time    06/04/13  8:48 PM      Result Value Range   Glucose-Capillary 201 (*) 70 - 99 mg/dL  GLUCOSE, CAPILLARY     Status: Abnormal   Collection Time    06/05/13  7:40 AM      Result Value Range   Glucose-Capillary 202 (*) 70 - 99 mg/dL  GLUCOSE, CAPILLARY     Status: Abnormal   Collection Time    06/05/13 11:14 AM      Result Value Range   Glucose-Capillary 202 (*) 70 - 99 mg/dL  GLUCOSE, CAPILLARY     Status: Abnormal   Collection Time    06/05/13  3:48 PM      Result Value Range   Glucose-Capillary 110 (*) 70 - 99 mg/dL  GLUCOSE, CAPILLARY     Status: Abnormal   Collection Time    06/05/13  9:10 PM      Result Value Range   Glucose-Capillary 228 (*) 70 - 99 mg/dL  GLUCOSE, CAPILLARY     Status: Abnormal   Collection Time    06/06/13  7:22 AM      Result Value Range   Glucose-Capillary 199 (*) 70 - 99 mg/dL  GLUCOSE, CAPILLARY     Status: Abnormal   Collection Time    06/06/13 12:34 PM      Result Value Range   Glucose-Capillary 166 (*) 70 - 99 mg/dL  GLUCOSE, CAPILLARY     Status: Abnormal   Collection Time    06/06/13  4:32 PM      Result Value Range   Glucose-Capillary 135 (*) 70 - 99 mg/dL  GLUCOSE, CAPILLARY     Status: Abnormal   Collection Time    06/06/13  8:32 PM      Result Value Range   Glucose-Capillary 103 (*) 70 - 99 mg/dL  BASIC METABOLIC PANEL     Status: Abnormal   Collection Time    06/07/13  5:40 AM      Result Value Range   Sodium 140  135 - 145 mEq/L   Potassium 4.2  3.5 - 5.1 mEq/L   Chloride 104  96 - 112 mEq/L   CO2 27  19 - 32 mEq/L   Glucose, Bld 157 (*) 70 - 99 mg/dL   BUN 19  6 - 23 mg/dL   Creatinine, Ser 4.78  0.50 - 1.10 mg/dL   Calcium 9.2  8.4 - 29.5 mg/dL   GFR calc non Af Amer 83 (*) >90 mL/min   GFR calc Af Amer >90  >  90 mL/min   Comment: (NOTE)     The eGFR has been calculated using the CKD EPI equation.     This calculation has not been validated in all clinical situations.     eGFR's persistently <90 mL/min signify possible Chronic Kidney     Disease.  GLUCOSE, CAPILLARY     Status: Abnormal   Collection Time    06/07/13  7:40 AM      Result Value Range   Glucose-Capillary 203 (*) 70 - 99 mg/dL     HEENT: normal Cardio: RRR and no murmur Resp: CTA B/L and unlabored GI: BS positive and non distended Extremity:  Pulses positive and No Edema Skin:   Intact Neuro: Alert/Oriented, Cranial Nerve Abnormalities R central 7,, Abnormal Sensory reduced sensory on Right side and Abnormal Motor 3-/5 in RUE, 3 minus/5 right hip flexor knee extensor 2 minus/5 ankle dorsiflexor and plantar flexed 5/5 in the left upper and left lower limb Musc/Skel: No pain with R wrist or shoulder ROM, no swelling or erythema Gen NAD   Assessment/Plan: 1. 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 Physiatrist is providing close team supervision and 24 hour management of active medical problems listed below. Physiatrist and rehab team continue to assess barriers  to discharge/monitor patient progress toward functional and medical goals.  FIM: FIM - Bathing Bathing Steps Patient Completed: Chest;Right Arm;Abdomen;Right upper leg;Left upper leg;Left lower leg (including foot);Left Arm;Front perineal area Bathing: 3: Mod-Patient completes 5-7 45f 10 parts or 50-74%  FIM - Upper Body Dressing/Undressing Upper body dressing/undressing steps patient completed: Thread/unthread left sleeve of pullover shirt/dress;Put head through opening of pull over shirt/dress;Thread/unthread right sleeve of pullover shirt/dresss Upper body dressing/undressing: 5: Set-up assist to: Obtain clothing/put away FIM - Lower Body Dressing/Undressing Lower body dressing/undressing steps patient completed: Thread/unthread left pants leg;Don/Doff left shoe;Pull pants up/down;Don/Doff right sock;Don/Doff left sock;Thread/unthread right pants leg Lower body dressing/undressing: 3: Mod-Patient completed 50-74% of tasks  FIM - Toileting Toileting: 1: Two helpers  FIM - Diplomatic Services operational officer Devices: Elevated toilet seat Toilet Transfers: 3-To toilet/BSC: Mod A (lift or lower assist);3-From toilet/BSC: Mod A (lift or lower assist)  FIM - Bed/Chair Transfer Bed/Chair Transfer Assistive Devices: Bed rails;Arm rests Bed/Chair Transfer: 3: Bed > Chair or W/C: Mod A (lift or lower assist);3: Supine > Sit: Mod A (lifting assist/Pt. 50-74%/lift 2 legs  FIM - Locomotion: Wheelchair Distance: Pt unable to attempt due to LEs not able to reach floor.  Locomotion: Wheelchair: 1: Total Assistance/staff pushes wheelchair (Pt<25%) FIM - Locomotion: Ambulation Locomotion: Ambulation Assistive Devices: Walker - Rolling;Orthosis Ambulation/Gait Assistance: 1: +2 Total assist Locomotion: Ambulation: 1: Two helpers  Comprehension Comprehension Mode: Auditory Comprehension: 3-Understands basic 50 - 74% of the time/requires cueing 25 - 50%  of the time  Expression Expression  Mode: Verbal Expression: 4-Expresses basic 75 - 89% of the time/requires cueing 10 - 24% of the time. Needs helper to occlude trach/needs to repeat words.  Social Interaction Social Interaction Mode: Asleep Social Interaction: 4-Interacts appropriately 75 - 89% of the time - Needs redirection for appropriate language or to initiate interaction.  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 DVT Prophylaxis/Anticoagulation: Pharmaceutical: Lovenox  2. Pain Management: Will continue prn tramadol for chronic RLE/right knee pain.  3. Mood: difficulty to ascertain with language as well as language barrier. Family to assist with input. LCSW to follow for evaluation. Affect is bright,  4. Neuropsych: This patient is not capable of making decisions on her own behalf.  5. HTN: Will monitor with bid checks. 6. DM type 2: amaryl and SSI Monitor BS with ac/hs checks and use SSI for elevated BS. Titrate as indicated for BS control. Poor control increase metformin 850 mg twice a day, monitor 7. H/o angina: Will continue Ranexa. Negative cath reported this summer 8. Iron deficiency anemia: will add iron supplement. Check Vit B12 levels.  9.  Urinary retention and proteus UTI, completed Keflex for 7 days,on urecholine and flomax, starting to void, continue intermittent catheterization for post void residuals of greater than 300 mL     LOS (Days) 14 A FACE TO FACE EVALUATION WAS PERFORMED  Stacie [name] 06/07/2013, 8:28 AM

## 2013-06-07 NOTE — Progress Notes (Signed)
Occupational Therapy Session Note  Patient Details  Name: Stacie [name] MRN: [id number] Date of Birth: 17-May-1941  Today's Date: 06/07/2013 Time: 1100-1200 Time Calculation (min): 60 min  Short Term Goals: Week 2:  OT Short Term Goal 1 (Week 2): Pt will use the RUE as a stabilizer during selfcare tasks with supervision and min instructional cueing. OT Short Term Goal 2 (Week 2): Pt will perform LB bathing sit to stand with mod assist using AE PRN. OT Short Term Goal 3 (Week 2): Pt will perform LB dressing sit to stand to donn pullover pants with mod assist.  OT Short Term Goal 4 (Week 2): Pt will perform stand/squat pivot toilet transfer with mod assist to drop arm commode.  Skilled Therapeutic Interventions/Progress Updates:  Patient resting in w/c with interpreter in room upon arrival.  Engaged in shower, dress and groom with focus on activity tolerance, shower transfers, sit><stand, dynamic standing balance, forced use of RUE, adaptive techniques, and use of AE for LB bathing & dressing.  Patient continues to struggle with LB bath and dress due to right sided weakness, decreased standing balance, and obesity.  She has begun to embrace learning to use AE to improve independence yet still requires significant practice. Performed LB dressing in regular chair secondary has lower seat to floor height and patient able to perform better.  Patient stood at sink with min-supervision during LB dressing to pull up her pants.  Patient resting in w/c with half lap tray and all items in reach with a trial of not donning QRB and RN aware.  Through interpreter, patient stated that she would call the nursing station if she needed to use the bathroom or wants to get back to bed.  Therapy Documentation Precautions:  Precautions Precautions: Fall Precaution Comments: dense R hemiplegia, pain in R wrist (maybe from fall), pain in R knee, expressive aphasia Restrictions Weight Bearing  Restrictions: No Pain: No reports of pain ADL: See FIM for current functional status  Therapy/Group: Individual Therapy  Darleen Moffitt 06/07/2013, 3:37 PM

## 2013-06-08 ENCOUNTER — Inpatient Hospital Stay (HOSPITAL_COMMUNITY): Payer: Medicaid Other | Admitting: Occupational Therapy

## 2013-06-08 ENCOUNTER — Inpatient Hospital Stay (HOSPITAL_COMMUNITY): Payer: Medicaid Other | Admitting: Speech Pathology

## 2013-06-08 ENCOUNTER — Ambulatory Visit (HOSPITAL_COMMUNITY): Payer: Self-pay | Admitting: Rehabilitation

## 2013-06-08 LAB — GLUCOSE, CAPILLARY
Glucose-Capillary: 81 mg/dL (ref 70–99)
Glucose-Capillary: 166 mg/dL — ABNORMAL HIGH (ref 70–99)
Glucose-Capillary: 199 mg/dL — ABNORMAL HIGH (ref 70–99)
Glucose-Capillary: 140 mg/dL — ABNORMAL HIGH (ref 70–99)
Glucose-Capillary: 113 mg/dL — ABNORMAL HIGH (ref 70–99)
Glucose-Capillary: 69 mg/dL — ABNORMAL LOW (ref 70–99)

## 2013-06-08 MED ORDER — INSULIN ASPART 100 UNIT/ML ~~LOC~~ SOLN
4.0000 [IU] | Freq: Three times a day (TID) | SUBCUTANEOUS | Status: DC
  Administered 2013-06-08 – 2013-06-16 (×23): 4 [IU] via SUBCUTANEOUS

## 2013-06-08 NOTE — Progress Notes (Signed)
Physical Therapy Weekly Progress Note  Patient Details  Name: Stacie [name] MRN: [id number] Date of Birth: 1940/11/16  Today's Date: 06/08/2013 Time: 4098-1191 Time Calculation (min): 62 min  Patient has met 4 of 4 short term goals.  Pt making marked gains this week in all aspects of mobility and currently requires min to moderate assist for bed mobility due to RUE weakness and body habitus.  She can now perform stand pivot transfers with RW or [facility] with min assist and is also ambulating with RW or LBQC at min to mod assist (mostly min assist, however when pt fatigues, note that she has increased difficulty clearing RLE, and requires mod assist to prevent LOB).  She no longer requires ace wrap on RLE to provide DF assist, as she is able to perform DF action on her own during swing phase of gait.    Patient continues to demonstrate the following deficits: decreased functional use of RUE, decreased strength in RLE, decreased dynamic standing balance, gait abnormality, decreased safety awareness and therefore will continue to benefit from skilled PT intervention to enhance overall performance with activity tolerance, balance, ability to compensate for deficits, functional use of  right upper extremity and right lower extremity, awareness, coordination and knowledge of precautions.  Patient progressing toward long term goals..  Continue plan of care.  Will more than likely upgrade goals to supervision level.   PT Short Term Goals Week 2:  PT Short Term Goal 1 (Week 2): Pt will perform bed mobility at mod assist consistently with HOB flat PT Short Term Goal 1 - Progress (Week 2): Met PT Short Term Goal 2 (Week 2): Pt will perform squat pivot transfer at mod assist R or L with safe technique PT Short Term Goal 2 - Progress (Week 2): Met PT Short Term Goal 3 (Week 2): Pt will perform standing balance with mod assist of one person to perform functional activities PT Short Term Goal 3  - Progress (Week 2): Met PT Short Term Goal 4 (Week 2): Pt will ambulate x 20' w/ LRAD with max assist of one person PT Short Term Goal 4 - Progress (Week 2): Met Week 3:  PT Short Term Goal 1 (Week 3): Pt will perform bed mobility at min assist consistently with HOB flat PT Short Term Goal 2 (Week 3): Pt will perform dynamic standing balance with min assist consistently to perform functional activities PT Short Term Goal 3 (Week 3): Pt will ambulate x 100' w/ LRAD with supervision and min cues for technique.  PT Short Term Goal 4 (Week 3): Pt will ascend/descend 3 stairs with B handrails at min assist to simulate home entry  Skilled Therapeutic Interventions/Progress Updates:   Pt received sitting in w/c in room with interpreter present during session.  Pt agreeable to therapy this morning.  Assisted pt to gym via w/c to continue gait training with [facility].  She was able to ambulate 28' with LBQC at min/guard assist level with mod cues for increased step length and increased DF on R for improved foot clearance during swing phase of gait.  Then progressed to ambulation through obstacle course negotiating around cones and over small poles to simulate tight spaces that she may encounter at home.  She was able to perform 20' of obstacle course at min assist with continued cues for correct placement of quad cane and also for correct sequencing when stepping over poles.  Note pt with increased difficulty with hip flex, esp as  she fatigued during activity. Performed seated nu step x 4 mins with BUE/LEs for overall strengthening at level 4 resistance and then another 3 mins with LEs only at level 2 resistance to target strength in RLE, esp hip extension.  Ended session with ironing activity in ADL apt to work on standing balance while weight shifting laterally and performing bilateral UE activity.  She was able to maintain standing at min/guard assist, however required assist at times to prevent fingers/hands from  getting too close to iron.  Returned pt to room via w/c with all needs left in reach and interpreter present.    Therapy Documentation Precautions:  Precautions Precautions: Fall Precaution Comments: dense R hemiplegia, pain in R wrist (maybe from fall), pain in R knee, expressive aphasia Restrictions Weight Bearing Restrictions: No General:   Vital Signs:   Pain: Pt with c/o pain in L knee during weight bearing activity.  RN notified and brought pain medicine to gym during session.  See FIM for current functional status  Therapy/Group: Individual Therapy and Co-Treatment (co-treat for 30 mins with RT)  Dub Maclellan, Meribeth Mattes 06/08/2013, 11:42 AM

## 2013-06-08 NOTE — Significant Event (Signed)
Hypoglycemic Event  CBG: 69  Treatment: 15 GM carbohydrate snack  Symptoms: None  Follow-up CBG: Time:2113 CBG Result:113  Possible Reasons for Event: Unknown  Comments/MD notified:Patient drank 240cc Coke, ate fruit cup and cheese. Showed no signs or symptoms and is resting comfortably. Will continue to monitor.    [name], Stacie Lopp G  Remember to initiate Hypoglycemia Order Set & complete

## 2013-06-08 NOTE — Progress Notes (Signed)
Occupational Therapy Session Note  Patient Details  Name: Stacie [name] MRN: [id number] Date of Birth: 05/04/1941  Today's Date: 06/08/2013 Time: 0730-0828 and 4098-1191 Time Calculation (min): 58 min and 27 min   Short Term Goals: Week 1:  OT Short Term Goal 1 (Week 1): Pt will perform UB dressing with min assist following hemi techniques. OT Short Term Goal 1 - Progress (Week 1): Met OT Short Term Goal 2 (Week 1): Pt will donn pullover pants with mod assist using AE PRN. OT Short Term Goal 2 - Progress (Week 1): Progressing toward goal OT Short Term Goal 3 (Week 1): Pt will transfer to drop arm commode with max assist. OT Short Term Goal 3 - Progress (Week 1): Met OT Short Term Goal 4 (Week 1): Pt will perform toilet hygiene and clothing management with max assist sit to stand. OT Short Term Goal 4 - Progress (Week 1): Met OT Short Term Goal 5 (Week 1): Pt will tolerate RUE splint wear for increased positioning. OT Short Term Goal 5 - Progress (Week 1): Met  Skilled Therapeutic Interventions/Progress Updates:  1.) Pt seen for ADL retraining with focus on increased participation with bathing and dressing. Pt in bed upon arrival, interpreter not present until 8am. Pt agreed to getting up and bathing this session. Pt completed bathing in w/c at sink sit >stand level requiring Min assist to stand while therapist washed buttocks. Pt able to don shirt and thread BLE in pants. Pt required Min assist-close supervsion to stand for therapist to don brief and pull up pants, pt attempted pulling up pants however had decreased grasp and required assist to pull up over right hip. Pt demonstrated use of sock aid to don both socks. Pt unable to don shoes this session however this could be due to w/c being too high. Plan to sit on lower surface for dressing next session as well as introduce shoe buttons to increase independence with fastening shoes. Pt required verbal cues to stand tall  and steady assist - close supervision to stand while performing oral care.  Pt left in w/c in room with interpreter.   2.) Pt seen 1:1 OT with focus on standing balance and FMC of RUE. Pt in w/c in room upon arrival. Wheeled to gym and engaged pt in standing and reaching activity using RUE. Provided verbal cues and tactile cues for pt to stand tall and tuck buttocks in. Pt completed first activity in standing for approx 4-5 min before requiring rest break. Pt required Min assist to stand and steadying assist-close supervision during activities. Pt completed second activity in standing for approx 4-5 min before resting. Engaged pt in BUE activity of stringing beads while sitting to promote increased FMC. Pt left in room in w/c.  Therapy Documentation  Precautions:  Precautions Precautions: Fall Precaution Comments: dense R hemiplegia, pain in R wrist (maybe from fall), pain in R knee, expressive aphasia Restrictions Weight Bearing Restrictions: No   Pain: Pain Assessment Pain Assessment: 0-10 Pain Score: 2  Pain Type: Acute pain Pain Location: Knee Pain Orientation: Right;Left Pain Descriptors / Indicators: Aching Pain Frequency: Intermittent Pain Onset: With Activity Patients Stated Pain Goal: 2 Pain Intervention(s): Medication (See eMAR) Multiple Pain Sites: No     See FIM for current functional status  Therapy/Group: Individual Therapy  Donna Christen 06/08/2013, 11:35 AM

## 2013-06-08 NOTE — Progress Notes (Signed)
Speech Language Pathology Daily Session Note  Patient Details  Name: Stacie [name] MRN: [id number] Date of Birth: 02-21-1941  Today's Date: 06/08/2013 Time: 5621-3086 Time Calculation (min): 34 min  Short Term Goals: Week 2: SLP Short Term Goal 1 (Week 2): Patient will tolerate regular textures and thin liquids with Min verbal cues to utilize safe swallow strategies. SLP Short Term Goal 2 (Week 2): Patient will verbalize basic wants/needs with Min questioning cues. SLP Short Term Goal 3 (Week 2): Patient will solve functional, complex problem solving in a understandable context with Mod verbal cues. SLP Short Term Goal 4 (Week 2): Patient will utilize external aids to recall basic, information with Mod verbal and questioning cues.  Skilled Therapeutic Interventions: Skilled treatment session focused on addressing cognitive-linguistic goals.  Interpreter was not present for session; SLP facilitated session with medication management task.  Patient utilized external aids in native language to assist with recall and carryover of names of mediations and their function.  Patient utilized with Min visual and verbal cues.  SLP also initiated use of pill box to increase accuracy with medication management, however, pt required Max-Total A for functional problem solving with task, suspect difficulty was impacted by language barrier.    FIM:  Comprehension Comprehension Mode: Auditory Comprehension: 5-Follows basic conversation/direction: With no assist Expression Expression Mode: Verbal Expression: 5-Expresses basic needs/ideas: With no assist Social Interaction Social Interaction: 4-Interacts appropriately 75 - 89% of the time - Needs redirection for appropriate language or to initiate interaction. Problem Solving Problem Solving: 4-Solves basic 75 - 89% of the time/requires cueing 10 - 24% of the time Memory Memory: 3-Recognizes or recalls 50 - 74% of the time/requires  cueing 25 - 49% of the time  Pain No s/s of pain  Therapy/Group: Individual Therapy  Cina Klumpp 06/08/2013, 3:20 PM

## 2013-06-08 NOTE — Progress Notes (Signed)
Recreational Therapy Assessment and Plan  Patient Details  Name: Stacie [name] MRN: [id number] Date of Birth: 1941/05/27 Today's Date: 06/08/2013  Rehab Potential: Good ELOS: 2 weeks   Assessment Clinical Impression: Problem List:  Patient Active Problem List    Diagnosis  Date Noted   .  CVA (cerebral infarction)  05/24/2013   .  Type II or unspecified type diabetes mellitus with neurological manifestations, not stated as uncontrolled(250.60)  05/24/2013   .  Dyslipidemia  05/24/2013   .  GERD (gastroesophageal reflux disease)  05/24/2013   .  Iron deficiency anemia, unspecified  05/24/2013    Past Medical History:  Past Medical History   Diagnosis  Date   .  Diabetes mellitus without complication    .  GERD (gastroesophageal reflux disease)    .  Right knee pain    .  Angina pectoris    .  Insomnia    .  Frequency of urination     Past Surgical History:  Past Surgical History   Procedure  Laterality  Date   .  Tendon  Right      heel tendon surgery    Assessment & Plan  Clinical Impression: Patient is a 72 y.o. year old non-english speaking [place] female with history of DM, HTN, angina (negative cath this summer); who was found on the floor by family with inability to speak or move her right side. She was taken to [facility] on 05/19/13 and MRI brain done revealing L-ACA infarct affecting medial frontal lobe and mild generalized small vessel disease. Carotid dopplers with <50% R-ICA stenosis. 2D echo with EF 55-60% with moderately elevated pulmonary artery pressures. Mild TVR. Neurology (Dr. 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 Patient continues to have dense right hemiparesis, is able to follow basic commands 25-50% accuracy (with interpreter) but mute. On mechanical soft diet. Therapies ongoing and working on pregait activities. Therapy team recommended CIR and patient admitted today for intensive therapies. Patient  transferred to CIR on 05/24/2013.   Pt presents with decreased activity tolerance, decreased functional mobility, decreased balance, decreased awareness, decreased problem solving, right inattention, right sided weakness Limiting pt's independence with leisure/community pursuits.   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, medical Expression Interests: Music (Comment) Other Leisure Interests: Reading;Cooking/Baking;Housework Leisure Participation Style: With Family/Friends Awareness of Community Resources: Fair-identify 2 post discharge leisure resources Psychosocial / Spiritual Patient agreeable to Pet Therapy: No Does patient have pets?: No Social interaction - Mood/Behavior: Cooperative Firefighter Appropriate for Education?: Yes Recreational Therapy Orientation Orientation -Reviewed with patient: Available activity resources Strengths/Weaknesses Patient Strengths/Abilities: Willingness to participate Patient weaknesses: Physical limitations TR Patient demonstrates impairments in the following area(s): Endurance;Safety  Plan Rec Therapy Plan Is patient appropriate for Therapeutic Recreation?: Yes Rehab Potential: Good Treatment times per week: Min 1 time per week>20 minutes Estimated Length of Stay: 2 weeks TR Treatment/Interventions: Adaptive equipment instruction;1:1 session;Balance/vestibular training;Functional mobility training;Community reintegration;Cognitive remediation/compensation;Patient/family education;Therapeutic activities;Recreation/leisure participation;Therapeutic exercise;UE/LE Coordination activities  Recommendations for other services: None  Discharge Criteria: Patient will be discharged from TR if patient refuses treatment 3 consecutive times without medical reason.  If treatment goals not met, if there is a change in medical status, if patient makes no progress  towards goals or if patient is discharged from hospital.  The above assessment, treatment plan, treatment alternatives and goals were discussed and mutually agreed upon: by patient  Adolph Clutter 06/08/2013, 3:09 PM

## 2013-06-08 NOTE — Progress Notes (Signed)
Subjective/Complaints: No translator today.  Pt is smiling No problems noted.. Starting to void but also requiring some intermittent catheterization "no dolor"  Review of Systems - R shoulder pain   Objective: Vital Signs: Blood pressure 110/68, pulse 66, temperature 98.1 F (36.7 C), temperature source Oral, resp. rate 18, weight 79.833 kg (176 lb), SpO2 97.00%. No results found. Results for orders placed during the hospital encounter of 05/24/13 (from the past 72 hour(s))  GLUCOSE, CAPILLARY     Status: Abnormal   Collection Time    06/05/13 11:14 AM      Result Value Range   Glucose-Capillary 202 (*) 70 - 99 mg/dL  GLUCOSE, CAPILLARY     Status: Abnormal   Collection Time    06/05/13  3:48 PM      Result Value Range   Glucose-Capillary 110 (*) 70 - 99 mg/dL  GLUCOSE, CAPILLARY     Status: Abnormal   Collection Time    06/05/13  9:10 PM      Result Value Range   Glucose-Capillary 228 (*) 70 - 99 mg/dL  GLUCOSE, CAPILLARY     Status: Abnormal   Collection Time    06/06/13  7:22 AM      Result Value Range   Glucose-Capillary 199 (*) 70 - 99 mg/dL  GLUCOSE, CAPILLARY     Status: Abnormal   Collection Time    06/06/13 12:34 PM      Result Value Range   Glucose-Capillary 166 (*) 70 - 99 mg/dL  GLUCOSE, CAPILLARY     Status: Abnormal   Collection Time    06/06/13  4:32 PM      Result Value Range   Glucose-Capillary 135 (*) 70 - 99 mg/dL  GLUCOSE, CAPILLARY     Status: Abnormal   Collection Time    06/06/13  8:32 PM      Result Value Range   Glucose-Capillary 103 (*) 70 - 99 mg/dL  BASIC METABOLIC PANEL     Status: Abnormal   Collection Time    06/07/13  5:40 AM      Result Value Range   Sodium 140  135 - 145 mEq/L   Potassium 4.2  3.5 - 5.1 mEq/L   Chloride 104  96 - 112 mEq/L   CO2 27  19 - 32 mEq/L   Glucose, Bld 157 (*) 70 - 99 mg/dL   BUN 19  6 - 23 mg/dL   Creatinine, Ser 1.61  0.50 - 1.10 mg/dL   Calcium 9.2  8.4 - 09.6 mg/dL   GFR calc non Af Amer 83 (*)  >90 mL/min   GFR calc Af Amer >90  >90 mL/min   Comment: (NOTE)     The eGFR has been calculated using the CKD EPI equation.     This calculation has not been validated in all clinical situations.     eGFR's persistently <90 mL/min signify possible Chronic Kidney     Disease.  GLUCOSE, CAPILLARY     Status: Abnormal   Collection Time    06/07/13  7:40 AM      Result Value Range   Glucose-Capillary 203 (*) 70 - 99 mg/dL  GLUCOSE, CAPILLARY     Status: Abnormal   Collection Time    06/07/13 12:03 PM      Result Value Range   Glucose-Capillary 150 (*) 70 - 99 mg/dL  GLUCOSE, CAPILLARY     Status: Abnormal   Collection Time    06/07/13  4:18 PM  Result Value Range   Glucose-Capillary 110 (*) 70 - 99 mg/dL  GLUCOSE, CAPILLARY     Status: Abnormal   Collection Time    06/07/13  8:37 PM      Result Value Range   Glucose-Capillary 68 (*) 70 - 99 mg/dL  GLUCOSE, CAPILLARY     Status: None   Collection Time    06/07/13  8:58 PM      Result Value Range   Glucose-Capillary 81  70 - 99 mg/dL   Comment 1 Notify RN    GLUCOSE, CAPILLARY     Status: Abnormal   Collection Time    06/08/13  7:31 AM      Result Value Range   Glucose-Capillary 166 (*) 70 - 99 mg/dL     HEENT: normal Cardio: RRR and no murmur Resp: CTA B/L and unlabored GI: BS positive and non distended Extremity:  Pulses positive and No Edema Skin:   Intact Neuro: Alert/Oriented, Cranial Nerve Abnormalities R central 7,, Abnormal Sensory reduced sensory on Right side and Abnormal Motor 3-/5 in RUE, 3 minus/5 right hip flexor knee extensor 2 minus/5 ankle dorsiflexor and plantar flexed 5/5 in the left upper and left lower limb Musc/Skel: No pain with R wrist or shoulder ROM, no swelling or erythema Gen NAD   Assessment/Plan: 1. Functional deficits secondary to R severe Hemiparesis from small vessel infarct related tio DM which require 3+ hours per day of interdisciplinary therapy in a comprehensive inpatient rehab  setting. Physiatrist is providing close team supervision and 24 hour management of active medical problems listed below. Physiatrist and rehab team continue to assess barriers to discharge/monitor patient progress toward functional and medical goals.  FIM: FIM - Bathing Bathing Steps Patient Completed: Chest;Right Arm;Abdomen;Right upper leg;Left upper leg;Left lower leg (including foot);Left Arm Bathing: 3: Mod-Patient completes 5-7 29f 10 parts or 50-74%  FIM - Upper Body Dressing/Undressing Upper body dressing/undressing steps patient completed: Thread/unthread left sleeve of pullover shirt/dress;Put head through opening of pull over shirt/dress;Thread/unthread right sleeve of pullover shirt/dresss;Pull shirt over trunk Upper body dressing/undressing: 5: Set-up assist to: Obtain clothing/put away FIM - Lower Body Dressing/Undressing Lower body dressing/undressing steps patient completed: Thread/unthread left pants leg;Don/Doff left shoe;Pull pants up/down;Don/Doff right sock;Thread/unthread right pants leg (reacher, sock aid & LH shoe horn) Lower body dressing/undressing: 2: Max-Patient completed 25-49% of tasks  FIM - Toileting Toileting: 1: Two helpers  FIM - Diplomatic Services operational officer Devices: Elevated toilet seat Toilet Transfers: 4-To toilet/BSC: Min A (steadying Pt. > 75%);4-From toilet/BSC: Min A (steadying Pt. > 75%)  FIM - Bed/Chair Transfer Bed/Chair Transfer Assistive Devices: Bed rails;Arm rests Bed/Chair Transfer: 4: Supine > Sit: Min A (steadying Pt. > 75%/lift 1 leg);4: Sit > Supine: Min A (steadying pt. > 75%/lift 1 leg);4: Bed > Chair or W/C: Min A (steadying Pt. > 75%);4: Chair or W/C > Bed: Min A (steadying Pt. > 75%)  FIM - Locomotion: Wheelchair Distance: Pt unable to attempt due to LEs not able to reach floor.  Locomotion: Wheelchair: 1: Total Assistance/staff pushes wheelchair (Pt<25%) FIM - Locomotion: Ambulation Locomotion: Ambulation  Assistive Devices: Occupational hygienist Ambulation/Gait Assistance: 4: Min assist Locomotion: Ambulation: 2: Travels 50 - 149 ft with minimal assistance (Pt.>75%)  Comprehension Comprehension Mode: Auditory Comprehension: 5-Follows basic conversation/direction: With extra time/assistive device  Expression Expression Mode: Verbal Expression: 4-Expresses basic 75 - 89% of the time/requires cueing 10 - 24% of the time. Needs helper to occlude trach/needs to repeat words.  Social Interaction  Social Interaction Mode: Asleep Social Interaction: 4-Interacts appropriately 75 - 89% of the time - Needs redirection for appropriate language or to initiate interaction.  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 DVT Prophylaxis/Anticoagulation: Pharmaceutical: Lovenox  2. Pain Management: Will continue prn tramadol for chronic RLE/right knee pain.  3. Mood: difficulty to ascertain with language as well as language barrier. Family to assist with input. LCSW to follow for evaluation. Affect is bright,  4. Neuropsych: This patient is  capable of making decisions on her own behalf.  5. HTN: Will monitor with bid checks. 6. DM type 2: amaryl and SSI Monitor BS with ac/hs checks and use SSI for elevated BS. Titrate as indicated for BS control. Poor control increase metformin 850 mg twice a day, monitor 7. H/o angina: Will continue Ranexa. Negative cath reported this summer 8. Iron deficiency anemia: will add iron supplement. Check Vit B12 levels.  9.  Urinary retention and proteus UTI, completed Keflex for 7 days,on urecholine and flomax, starting to void, continue intermittent catheterization for post void residuals of  greater than 300 mL     LOS (Days) 15 A FACE TO FACE EVALUATION WAS PERFORMED  Safa Derner E 06/08/2013, 8:38 AM

## 2013-06-09 ENCOUNTER — Inpatient Hospital Stay (HOSPITAL_COMMUNITY): Payer: Self-pay | Admitting: Rehabilitation

## 2013-06-09 ENCOUNTER — Inpatient Hospital Stay (HOSPITAL_COMMUNITY): Payer: Medicaid Other | Admitting: Speech Pathology

## 2013-06-09 ENCOUNTER — Encounter (HOSPITAL_COMMUNITY): Payer: Self-pay | Admitting: Occupational Therapy

## 2013-06-09 LAB — GLUCOSE, CAPILLARY
Glucose-Capillary: 120 mg/dL — ABNORMAL HIGH (ref 70–99)
Glucose-Capillary: 178 mg/dL — ABNORMAL HIGH (ref 70–99)

## 2013-06-09 NOTE — Patient Care Conference (Signed)
Inpatient RehabilitationTeam Conference and Plan of Care Update Date: 06/09/2013   Time: 11;00 AM    Patient Name: Stacie [name]      Medical Record Number: [id number]  Date of Birth: 02-26-1941 Sex: Female         Room/Bed: 4W07C/4W07C-01 Payor Info: Payor: MEDICAID POTENTIAL / Plan: MEDICAID POTENTIAL / Product Type: *No Product type* /    Admitting Diagnosis: L CVA  Admit Date/Time:  05/24/2013  1:08 PM Admission Comments: No comment available   Primary Diagnosis:  CVA (cerebral infarction) Principal Problem: CVA (cerebral infarction)  Patient Active Problem List   Diagnosis Date Noted  . CVA (cerebral infarction) 05/24/2013  . Type II or unspecified type diabetes mellitus with neurological manifestations, not stated as uncontrolled(250.60) 05/24/2013  . Dyslipidemia 05/24/2013  . GERD (gastroesophageal reflux disease) 05/24/2013  . Iron deficiency anemia, unspecified 05/24/2013    Expected Discharge Date: Expected Discharge Date: 06/16/13  Team Members Present: Physician leading conference: Dr. Claudette Laws Social Worker Present: Staci Acosta, LCSW;Becky Channon Ambrosini, LCSW Nurse Present: Leland Johns, RN PT Present: Wanda Plump, PT OT Present: Bretta Bang, Carollee Sires, OT SLP Present: Maxcine Ham, SLP PPS Coordinator present : Edson Snowball, Chapman Fitch, RN, CRRN     Current Status/Progress Goal Weekly Team Focus  Medical   upper ext and LE improved strength   upgrade goals  family training   Bowel/Bladder   incontinent of bladder, bladder scan q 4hr, I&O cath for vol. >350cc  Continent of bowel and bladder with mod assist  Timed toileting q 2-3 hr.   Swallow/Nutrition/ Hydration   regular textures and thin liquids with set-up assist   Least restrictive p.o. intake  goals met   ADL's   supervision UB dressing and bathing, min assist LB bathing with use of long handled sponge, max assist LB dressing, Min-close supervision standing,  Min assist transfers  Goals are overall min assist level for transfers and LB selfcare sit to stand.  Supervision for UB dressing, grooming, and self feeding.  selfcare retraining, neuromuscular re-education, pt/family education    Mobility   Pt requires mod assist for bed mobility (more due to body habitus), min assist for standing and gait training.  Pt has made marked gains in therapy in the past week, however continues to have increased weaknes, esp hip abd and hip ext in RLE.    Goals will need to be updated to supervision/min assist overall  NMR for RUE/LE, dynamic standing balance, gait training, pt/family education, stair training,    Communication   Supervision   Supervision   family education   Safety/Cognition/ Behavioral Observations  Min-Mod assist   Supervision   increase use of external aids; family education   Pain   no c/o pain  <3   assess for pain q shift   Skin   bruising on abdomen from lovenox and insulin injections, small reddened area to Rt. buttock  No skin breakdown  Monitor and assess skin q shift      *See Care Plan and progress notes for long and short-term goals.  Barriers to Discharge: no ins so very little f/u therapy available    Possible Resolutions to Barriers:  upgrade to Sup, instruct HEP    Discharge Planning/Teaching Needs:  Family was here Friday tio observe in therapies, aware she will ened someone with her at all times.  Concerned about voiding issues      Team Discussion:  Pt surpassing goals and can go home sooner.  I & O cath once a day hopeful will not need at d/c.  Will need fmaily education prior to discharge  Revisions to Treatment Plan:  Moved up discharge to 12/10 due to progress. Upgraded goals to supervision/min level   Continued Need for Acute Rehabilitation Level of Care: The patient requires daily medical management by a physician with specialized training in physical medicine and rehabilitation for the following  conditions: Daily direction of a multidisciplinary physical rehabilitation program to ensure safe treatment while eliciting the highest outcome that is of practical value to the patient.: Yes Daily medical management of patient stability for increased activity during participation in an intensive rehabilitation regime.: Yes Daily analysis of laboratory values and/or radiology reports with any subsequent need for medication adjustment of medical intervention for : Neurological problems;Other  Markiah Janeway, Lemar Livings 06/09/2013, 1:01 PM

## 2013-06-09 NOTE — Progress Notes (Signed)
Social Work Patient ID: Stacie [name], female   DOB: 1940/12/13, 72 y.o.   MRN: [id number] Left message with Efraim Kaufmann to inform of team conference progression toward goals and discharge date moved up to 12/10.  Pt is reaching her goals sooner than expected. Efraim Kaufmann has been here and observed pt in therapies.  Informed MD feels will not need insulin at home or I & O cathed.  Will await her return call.

## 2013-06-09 NOTE — Progress Notes (Signed)
Physical Therapy Session Note  Patient Details  Name: Stacie [name] MRN: [id number] Date of Birth: March 20, 1941  Today's Date: 06/09/2013 Time: 4098-1191 Time Calculation (min): 31 min  Short Term Goals: Week 3:  PT Short Term Goal 1 (Week 3): Pt will perform bed mobility at min assist consistently with HOB flat PT Short Term Goal 2 (Week 3): Pt will perform dynamic standing balance with min assist consistently to perform functional activities PT Short Term Goal 3 (Week 3): Pt will ambulate x 100' w/ LRAD with supervision and min cues for technique.  PT Short Term Goal 4 (Week 3): Pt will ascend/descend 3 stairs with B handrails at min assist to simulate home entry  Skilled Therapeutic Interventions/Progress Updates:   Pt received lying in bed this afternoon and agreeable to therapy.  Performed supine>sit at [facility] assist with [facility] flat and without rails.  Continues to require mod assist to elevate trunk into sitting due to body habitus and decreased strength in RUE.  Asked pt whether she needed to use restroom and pt responded yes, therefore ambulated to restroom with [facility] at min/guard assist with continued cues for upright posture and increased R step length with increased ankle DF, hip/knee flex.  Pt able to adjust clothing prior to and following toileting, however did require some assist for R side of clothing.  She was also able to perform peri care.  Ambulated to sink where she was able to wash her hands at supervision level.  Took seated rest break to allow RN to give meds then continued with gait training with LBQC x 100' at supervision mostly, however did require min assist on one occasion due to LOB from not fully advancing RLE.  Note she did much better this afternoon consistently advancing RLE.  Ended session with backwards gait x 20' in order to continue working on hip ext strength.  She is able to perform at min assist level and note improved steps this afternoon compared  to this morning.  Returned pt to w/c in room with all needs in reach.   Therapy Documentation Precautions:  Precautions Precautions: Fall Precaution Comments: right hemiparesis, expressive difficulty Restrictions Weight Bearing Restrictions: No Vital Signs: Therapy Vitals Temp: 98 F (36.7 C) Temp src: Oral Pulse Rate: 66 Resp: 18 BP: 120/57 mmHg Patient Position, if appropriate: Lying Oxygen Therapy SpO2: 95 % O2 Device: None (Room air) Pain: Pt with 3/10 pain in L knee.  RN aware.    Locomotion : Ambulation Ambulation/Gait Assistance: 4: Min guard   See FIM for current functional status  Therapy/Group: Individual Therapy  Vista Deck 06/09/2013, 4:07 PM

## 2013-06-09 NOTE — Progress Notes (Signed)
Physical Therapy Session Note  Patient Details  Name: Stacie [name] MRN: [id number] Date of Birth: [birth year]  Today's Date: 06/09/2013 Time: 0830-0926 Time Calculation (min): 56 min  Short Term Goals: Week 3:  PT Short Term Goal 1 (Week 3): Pt will perform bed mobility at min assist consistently with HOB flat PT Short Term Goal 2 (Week 3): Pt will perform dynamic standing balance with min assist consistently to perform functional activities PT Short Term Goal 3 (Week 3): Pt will ambulate x 100' w/ LRAD with supervision and min cues for technique.  PT Short Term Goal 4 (Week 3): Pt will ascend/descend 3 stairs with B handrails at min assist to simulate home entry  Skilled Therapeutic Interventions/Progress Updates:   Pt received sitting in w/c with interpretor present for session to translate.  Focus of session was gait training in controlled and busy/carpeted environment to simulate home and community environment in gift shop downstairs.  Performed gait training >150' on several occasions with seated rest breaks in between due to fatigue.  Note that as pt fatigues, it becomes more difficult for pt to perform R ankle DF in order to clear foot.  Note that she also has decreased balance strategies when she does lose her balance during session.  Ambulated through [place] and gift shop to simulate home and community environment (busy, carpeted, tight spaces).  She was able to complete all gait training through these environments with min assist, however did require heavy min assist at one time for LOB due to decreased ankle DF on RLE during gait.  Assisted back to rehab unit and performed 25' of backwards walking in hallway to work on hip extension strength in RLE with improved weight shifts to the L to properly clear RLE.  She tends to lose balance backwards and also reaches for furniture to correct rather than using ankle, hip or stepping strategy.  Pt left in room in w/c with all  needs in reach and interpreter in room.    Therapy Documentation Precautions:  Precautions Precautions: Fall Precaution Comments: dense R hemiplegia, pain in R wrist (maybe from fall), pain in R knee, expressive aphasia Restrictions Weight Bearing Restrictions: No   Pain: pt continues to have arthritis pain in L knee with prolonged WB activity.    Locomotion : Ambulation Ambulation/Gait Assistance: 4: Min assist;4: Min guard   See FIM for current functional status  Therapy/Group: Individual Therapy  Vista Deck 06/09/2013, 11:24 AM

## 2013-06-09 NOTE — Progress Notes (Signed)
Subjective/Complaints:  translator today.  Pt is smiling No problems noted.. Starting to void but also requiring some intermittent catheterization No pain complaints, tolerating therapy well.   Review of Systems - R shoulder pain improved, denies bowel bladder issues, denies breathing issues, remainder of review negative  Objective: Vital Signs: Blood pressure 107/67, pulse 69, temperature 98.7 F (37.1 C), temperature source Oral, resp. rate 17, weight 79.833 kg (176 lb), SpO2 96.00%. No results found. Results for orders placed during the hospital encounter of 05/24/13 (from the past 72 hour(s))  GLUCOSE, CAPILLARY     Status: Abnormal   Collection Time    06/06/13 12:34 PM      Result Value Range   Glucose-Capillary 166 (*) 70 - 99 mg/dL  GLUCOSE, CAPILLARY     Status: Abnormal   Collection Time    06/06/13  4:32 PM      Result Value Range   Glucose-Capillary 135 (*) 70 - 99 mg/dL  GLUCOSE, CAPILLARY     Status: Abnormal   Collection Time    06/06/13  8:32 PM      Result Value Range   Glucose-Capillary 103 (*) 70 - 99 mg/dL  BASIC METABOLIC PANEL     Status: Abnormal   Collection Time    06/07/13  5:40 AM      Result Value Range   Sodium 140  135 - 145 mEq/L   Potassium 4.2  3.5 - 5.1 mEq/L   Chloride 104  96 - 112 mEq/L   CO2 27  19 - 32 mEq/L   Glucose, Bld 157 (*) 70 - 99 mg/dL   BUN 19  6 - 23 mg/dL   Creatinine, Ser 1.61  0.50 - 1.10 mg/dL   Calcium 9.2  8.4 - 09.6 mg/dL   GFR calc non Af Amer 83 (*) >90 mL/min   GFR calc Af Amer >90  >90 mL/min   Comment: (NOTE)     The eGFR has been calculated using the CKD EPI equation.     This calculation has not been validated in all clinical situations.     eGFR's persistently <90 mL/min signify possible Chronic Kidney     Disease.  GLUCOSE, CAPILLARY     Status: Abnormal   Collection Time    06/07/13  7:40 AM      Result Value Range   Glucose-Capillary 203 (*) 70 - 99 mg/dL  GLUCOSE, CAPILLARY     Status: Abnormal   Collection Time    06/07/13 12:03 PM      Result Value Range   Glucose-Capillary 150 (*) 70 - 99 mg/dL  GLUCOSE, CAPILLARY     Status: Abnormal   Collection Time    06/07/13  4:18 PM      Result Value Range   Glucose-Capillary 110 (*) 70 - 99 mg/dL  GLUCOSE, CAPILLARY     Status: Abnormal   Collection Time    06/07/13  8:37 PM      Result Value Range   Glucose-Capillary 68 (*) 70 - 99 mg/dL  GLUCOSE, CAPILLARY     Status: None   Collection Time    06/07/13  8:58 PM      Result Value Range   Glucose-Capillary 81  70 - 99 mg/dL   Comment 1 Notify RN    GLUCOSE, CAPILLARY     Status: Abnormal   Collection Time    06/08/13  7:31 AM      Result Value Range   Glucose-Capillary 166 (*)  70 - 99 mg/dL  GLUCOSE, CAPILLARY     Status: Abnormal   Collection Time    06/08/13 11:41 AM      Result Value Range   Glucose-Capillary 199 (*) 70 - 99 mg/dL  GLUCOSE, CAPILLARY     Status: Abnormal   Collection Time    06/08/13  4:46 PM      Result Value Range   Glucose-Capillary 140 (*) 70 - 99 mg/dL  GLUCOSE, CAPILLARY     Status: Abnormal   Collection Time    06/08/13  8:44 PM      Result Value Range   Glucose-Capillary 69 (*) 70 - 99 mg/dL   Comment 1 Notify RN    GLUCOSE, CAPILLARY     Status: Abnormal   Collection Time    06/08/13  9:13 PM      Result Value Range   Glucose-Capillary 113 (*) 70 - 99 mg/dL   Comment 1 Notify RN    GLUCOSE, CAPILLARY     Status: Abnormal   Collection Time    06/09/13  7:38 AM      Result Value Range   Glucose-Capillary 202 (*) 70 - 99 mg/dL   Comment 1 Notify RN       HEENT: normal Cardio: RRR and no murmur Resp: CTA B/L and unlabored GI: BS positive and non distended Extremity:  Pulses positive and No Edema Skin:   Intact Neuro: Alert/Oriented, Cranial Nerve Abnormalities R central 7,, Abnormal Sensory reduced sensory on Right side and Abnormal Motor 4-/5 in RUE, 3 minus/5 right hip flexor knee extensor 2 minus/5 ankle dorsiflexor and  plantar flexed 5/5 in the left upper and left lower limb Musc/Skel: No pain with R wrist or shoulder ROM, no swelling or erythema Gen NAD   Assessment/Plan: 1. 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 Physiatrist is providing close team supervision and 24 hour management of active medical problems listed below. Physiatrist and rehab team continue to assess barriers to discharge/monitor patient progress toward functional and medical goals. Team conference today please see physician documentation under team conference tab, met with team face-to-face to discuss problems,progress, and goals. Formulized individual treatment plan based on medical history, underlying problem and comorbidities. FIM: FIM - Bathing Bathing Steps Patient Completed: Chest;Right Arm;Abdomen;Right upper leg;Left upper leg;Left lower leg (including foot);Left Arm;Right lower leg (including foot) Bathing: 4: Min-Patient completes 8-9 77f 10 parts or 75+ percent  FIM - Upper Body Dressing/Undressing Upper body dressing/undressing steps patient completed: Thread/unthread left sleeve of pullover shirt/dress;Put head through opening of pull over shirt/dress;Thread/unthread right sleeve of pullover shirt/dresss;Pull shirt over trunk Upper body dressing/undressing: 5: Set-up assist to: Obtain clothing/put away FIM - Lower Body Dressing/Undressing Lower body dressing/undressing steps patient completed: Thread/unthread left pants leg;Pull pants up/down;Don/Doff right sock;Thread/unthread right pants leg;Don/Doff left sock Lower body dressing/undressing: 2: Max-Patient completed 25-49% of tasks  FIM - Toileting Toileting steps completed by patient: Adjust clothing prior to toileting;Performs perineal hygiene;Adjust clothing after toileting Toileting Assistive Devices: Grab bar  or rail for support Toileting: 5: Supervision: Safety issues/verbal cues  FIM - Diplomatic Services operational officer Devices: Elevated toilet seat Toilet Transfers: 4-To toilet/BSC: Min A (steadying Pt. > 75%);4-From toilet/BSC: Min A (steadying Pt. > 75%)  FIM - Bed/Chair Transfer Bed/Chair Transfer Assistive Devices: Bed rails Bed/Chair Transfer: 3: Bed > Chair or W/C: Mod A (lift or  lower assist);4: Supine > Sit: Min A (steadying Pt. > 75%/lift 1 leg)  FIM - Locomotion: Wheelchair Distance: Pt unable to attempt due to LEs not able to reach floor.  Locomotion: Wheelchair: 1: Total Assistance/staff pushes wheelchair (Pt<25%) FIM - Locomotion: Ambulation Locomotion: Ambulation Assistive Devices: Occupational hygienist Ambulation/Gait Assistance: 4: Min assist;4: Min guard Locomotion: Ambulation: 2: Travels 50 - 149 ft with minimal assistance (Pt.>75%)  Comprehension Comprehension Mode: Auditory Comprehension: 5-Follows basic conversation/direction: With no assist  Expression Expression Mode: Verbal Expression: 5-Expresses basic needs/ideas: With no assist  Social Interaction Social Interaction Mode: Asleep Social Interaction: 4-Interacts appropriately 75 - 89% of the time - Needs redirection for appropriate language or to initiate interaction.  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 DVT Prophylaxis/Anticoagulation: Pharmaceutical: Lovenox  2. Pain Management: Will continue prn tramadol for chronic RLE/right knee pain.  3. Mood: difficulty to ascertain with language as well as language barrier. Family to assist with input. LCSW to follow for evaluation. Affect is bright,  4.  Neuropsych: This patient is  capable of making decisions on her own behalf.  5. HTN: Will monitor with bid checks. 6. DM type 2: amaryl and SSI Monitor BS with ac/hs checks and use SSI for elevated BS. Titrate as indicated for BS control. improving control increase metformin 850 mg twice a day, monitor 7. H/o angina: Will continue Ranexa. Negative cath reported this summer 8. Iron deficiency anemia: will add iron supplement. Check Vit B12 levels.  9.  Urinary retention and proteus UTI, completed Keflex for 7 days,on urecholine and flomax, starting to void, continue intermittent catheterization for post void residuals of greater than 300 mL     LOS (Days) 16 A FACE TO FACE EVALUATION WAS PERFORMED  Stacie [name] E 06/09/2013, 9:26 AM

## 2013-06-09 NOTE — Progress Notes (Signed)
I have reviewed and agree with the attached treatment note.  Almee Pelphrey, OTR/L 

## 2013-06-09 NOTE — Progress Notes (Signed)
Speech Language Pathology Daily Session Note  Patient Details  Name: Stacie [name] MRN: [id number] Date of Birth: 07-03-1941  Today's Date: 06/09/2013 Time: 4098-1191 Time Calculation (min): 30 min  Short Term Goals: Week 2: SLP Short Term Goal 1 (Week 2): Patient will tolerate regular textures and thin liquids with Min verbal cues to utilize safe swallow strategies. SLP Short Term Goal 2 (Week 2): Patient will verbalize basic wants/needs with Min questioning cues. SLP Short Term Goal 3 (Week 2): Patient will solve functional, complex problem solving in a understandable context with Mod verbal cues. SLP Short Term Goal 4 (Week 2): Patient will utilize external aids to recall basic, information with Mod verbal and questioning cues.  Skilled Therapeutic Interventions: Skilled treatment session focused on addressing cognitive-linguistic goals.  Interpreter present for session; SLP facilitated session with medication management task.  SLP and interpreter implemented external aids in native language to assist with effective use of medication organizer.  Patient was able to recall names and functions of medicines with Supervision level question cues to utilize external aid created in previous session.   Recommend to continue with current plan of care and address accuracy with loading medication organizer in tomorrow's session.    FIM:  Comprehension Comprehension Mode: Auditory Comprehension: 5-Follows basic conversation/direction: With extra time/assistive device Expression Expression Mode: Verbal Expression: 5-Expresses basic needs/ideas: With extra time/assistive device Social Interaction Social Interaction: 4-Interacts appropriately 75 - 89% of the time - Needs redirection for appropriate language or to initiate interaction. Problem Solving Problem Solving: 5-Solves basic 90% of the time/requires cueing < 10% of the time Memory Memory: 5-Requires cues to use assistive  device FIM - Eating Eating Activity: 5: Needs verbal cues/supervision  Pain Pain Assessment Pain Assessment: No/denies pain  Therapy/Group: Individual Therapy  Charlane Ferretti., CCC-SLP [phone]  Stacie [name] 06/09/2013, 4:54 PM

## 2013-06-09 NOTE — Progress Notes (Signed)
Occupational Therapy Weekly Progress Note  Patient Details  Name: Stacie [name] MRN: [id number] Date of Birth: [birth year]  Today's Date: 06/09/2013 Time: 4098-1191 Time Calculation (min): 61 min  Patient has met 4 of 4 short term goals.  [name] has made outstanding progress over the past week with regards to ADL function and RUE functional use.  She is able to use the RUE functionally for bathing and dressing tasks with occasional min assist for FM coordination and when attempting to pull up pants on her right side.  She is able to stand for LB selfcare and for functional transfers at a min assist level.  She still exhibits difficulty reaching her LEs for donning clothing but is incorporating AE with max instructional cueing and min assist.  Feel she will continue to benefit from CIR level therapies in order to increase overall ADL function.  Goals have been updated to supervision level for most based on excellent progress. Patient continues to demonstrate the following deficits: decreased balance, decreased RUE and RLE strength, decreased coordination, and therefore will continue to benefit from skilled OT intervention to enhance overall performance with BADL.  Patient progressing toward long term goals..  Goals updated based on progress  OT Short Term Goals Week 3:  OT Short Term Goal 1 (Week 3): Pt will perform all bathing with supervision sit to stand using AE PRN. OT Short Term Goal 2 (Week 3): Pt will perform LB dressing sit to stand with AE and min assist level. OT Short Term Goal 3 (Week 3): Pt will perform all aspects of toileting with supervision including toilet transfer. OT Short Term Goal 4 (Week 3): Pt will perform tub/shower transfer with supervision level. OT Short Term Goal 5 (Week 3): Pt will use the RUE as an active assist to tie her shoes with supervision.    Skilled Therapeutic Interventions/Progress Updates:    Pt performed bathing and  dressing sit to stand at the sink.  Interpreter present for session as well.  Pt with much improved functional use of the RUE with bathing and dressing compared to last week.  Currently using the RUE for bathing tasks with supervision and increased time.  She does need min assist for pulling up pants with the RUE.  Integrated the reacher and sockaide during session with min assist and mod instructional cueing for donning socks and shoes.  Also provided shoe buttons and educated pt on use of them through the translator.  She was able to perform sit to stand with min assist but needs mod instructional cueing to push up from the wheelchair arm rests.  Pt is making excellent progress with OT.  Therapy Documentation Precautions:  Precautions Precautions: Fall Precaution Comments: right hemiparesis, expressive difficulty Restrictions Weight Bearing Restrictions: No  Pain: Pain Assessment Pain Assessment: No/denies pain ADL: See FIM for current functional status  Therapy/Group: Individual Therapy  Strummer Canipe OTR/L 06/09/2013, 11:38 AM

## 2013-06-10 ENCOUNTER — Encounter (HOSPITAL_COMMUNITY): Payer: Self-pay | Admitting: Occupational Therapy

## 2013-06-10 ENCOUNTER — Ambulatory Visit (HOSPITAL_COMMUNITY): Payer: Self-pay | Admitting: Rehabilitation

## 2013-06-10 ENCOUNTER — Inpatient Hospital Stay (HOSPITAL_COMMUNITY): Payer: Medicaid Other | Admitting: Speech Pathology

## 2013-06-10 ENCOUNTER — Inpatient Hospital Stay (HOSPITAL_COMMUNITY): Payer: Self-pay | Admitting: Occupational Therapy

## 2013-06-10 DIAGNOSIS — G8110 Spastic hemiplegia affecting unspecified side: Secondary | ICD-10-CM

## 2013-06-10 DIAGNOSIS — I6330 Cerebral infarction due to thrombosis of unspecified cerebral artery: Secondary | ICD-10-CM

## 2013-06-10 DIAGNOSIS — E1165 Type 2 diabetes mellitus with hyperglycemia: Secondary | ICD-10-CM

## 2013-06-10 LAB — GLUCOSE, CAPILLARY
Glucose-Capillary: 141 mg/dL — ABNORMAL HIGH (ref 70–99)
Glucose-Capillary: 75 mg/dL (ref 70–99)

## 2013-06-10 MED ORDER — DICLOFENAC SODIUM 1 % TD GEL
2.0000 g | Freq: Four times a day (QID) | TRANSDERMAL | Status: DC
  Administered 2013-06-10 – 2013-06-17 (×25): 2 g via TOPICAL
  Filled 2013-06-10 (×2): qty 100

## 2013-06-10 NOTE — Progress Notes (Signed)
Physical Therapy Session Note  Patient Details  Name: Stacie [name] MRN: [id number] Date of Birth: 1940-10-18  Today's Date: 06/10/2013 Time: 1100-1200 Time Calculation (min): 60 min  Short Term Goals: Week 3:  PT Short Term Goal 1 (Week 3): Pt will perform bed mobility at min assist consistently with HOB flat PT Short Term Goal 2 (Week 3): Pt will perform dynamic standing balance with min assist consistently to perform functional activities PT Short Term Goal 3 (Week 3): Pt will ambulate x 100' w/ LRAD with supervision and min cues for technique.  PT Short Term Goal 4 (Week 3): Pt will ascend/descend 3 stairs with B handrails at min assist to simulate home entry  Skilled Therapeutic Interventions/Progress Updates:   Pt received sitting in w/c with interpreter present during session.  Co-treatment with RT during session to work on gait training in community environment involving inclined and declined surfaces, uneven surfaces and stairs.  Prior to ambulation, pt states she needs to use restroom, therefore assisted into restroom with min assist and cues for decreased "furniture walking."  She was able to adjust clothing prior to and following toileting and also perform peri care.  She did require increased encouragement to attempt peri care.  Pt continues to require intermittent cues for increased step length on RLE as well as increased R ankle DF to fully clear foot.  Pt does well, however note that in busier environment or when spoken to, she is easily distracted and demonstrates increased LOB.  One instance of LOB during session.  Performed stairs outside with use of R handrail and quad cane with min assist with cues for correct sequencing/technique.  Note that she performs stairs more safely when ascending with R LE and descends with LLE due to increased pain in L knee.  Performed all gait with min assist with cues mentioned above.  Pt very close to supervision level in  controlled environment and demonstrates increased activity tolerance during session with increased ambulation distances.  Assisted pt back to therapy gym to perform gait training over mat to simulate outdoor or thicker carpeted surface with min assist and cues for correct sequence with quad cane, as well as increased hip/knee flexion and DF.  Pt ambulated back to room (well over 150' during session) at min/guard assist.  Left in w/c in room with all needs in reach.   Therapy Documentation Precautions:  Precautions Precautions: Fall Precaution Comments: right hemiparesis, expressive difficulty Restrictions Weight Bearing Restrictions: No   Vital Signs: Therapy Vitals Temp: 98.6 F (37 C) Temp src: Oral Pulse Rate: 73 Resp: 18 BP: 115/72 mmHg Patient Position, if appropriate: Lying Oxygen Therapy SpO2: 95 % O2 Device: None (Room air) Pain: Pain Assessment Pain Assessment: 10/10 L knee pain, RN notified   Locomotion : Ambulation Ambulation/Gait Assistance: 4: Min guard   See FIM for current functional status  Therapy/Group: Individual Therapy  Vista Deck 06/10/2013, 2:20 PM

## 2013-06-10 NOTE — Progress Notes (Signed)
Recreational Therapy Session Note  Patient Details  Name: Miracle Mongillo MRN: [id number] Date of Birth: 02-15-1941 Today's Date: 06/10/2013  Pain: c/o  Skilled Therapeutic Interventions/Progress Updates: Session focused on activity tolerance & community ambulation on even/uneven surfaces. Performed stairs outside with use of R handrail and quad cane with min assist with cues for correct sequencing/technique. Note that she performs stairs more safely when ascending with R LE and descends with LLE due to increased pain in L knee. Performed all gait with min assist with cues mentioned above. Pt very close to supervision level in controlled environment and demonstrates increased activity tolerance during session with increased ambulation distances. Assisted pt back to therapy gym to perform gait training over mat to simulate outdoor or thicker carpeted surface with min assist and cues for correct sequence with quad cane, as well as increased hip/knee flexion and DF. Pt ambulated back to room (well over 150' during session) at min/guard assist. Left in w/c in room with all needs in reach.   Keante Urizar 06/10/2013, 4:04 PM

## 2013-06-10 NOTE — Significant Event (Signed)
Hypoglycemic Event  CBG: 64  Treatment: 15 GM carbohydrate snack  Symptoms: None  Follow-up CBG: Time: 2141 CBG Result:75  Possible Reasons for Event: Unknown  Comments/MD notified:Dan Anguilli notified at 2224. Advised to continue to monitor pt throughout the night     Stacie [name]  Remember to initiate Hypoglycemia Order Set & complete

## 2013-06-10 NOTE — Progress Notes (Signed)
Occupational Therapy Session Note  Patient Details  Name: Stacie [name] MRN: [id number] Date of Birth: 07-13-1940  Today's Date: 06/10/2013 Time: 0800-0859 Time Calculation (min): 59 min  Short Term Goals: Week 3:  OT Short Term Goal 1 (Week 3): Pt will perform all bathing with supervision sit to stand using AE PRN. OT Short Term Goal 2 (Week 3): Pt will perform LB dressing sit to stand with AE and min assist level. OT Short Term Goal 3 (Week 3): Pt will perform all aspects of toileting with supervision including toilet transfer. OT Short Term Goal 4 (Week 3): Pt will perform tub/shower transfer with supervision level. OT Short Term Goal 5 (Week 3): Pt will use the RUE as an active assist to tie her shoes with supervision.    Skilled Therapeutic Interventions/Progress Updates:    Pt performed toilet transfer with min assist, ambulating from the EOB to the bathroom using her quad cane.  She was able to perform initial clothing management and hygiene with min assist.  Pt transferred from the toilet to the tub bench with min assist.  She was able to perform all bathing with min assist sit to stand, incorporating the LH sponge and therapist giving mod instructional cueing to remember to wash her peri area.  Transferred back to the wheelchair in front of the sink for dressing.  Pt utilized reacher, sockaide, and LH shoe horn during session.  Pt continues to need mod instructional cueing to use the reacher to assist with donning shoes.  Also incorporated stool for propping her feet on when attempting to place her heel in the shoe.  She was able to fasten the shoes after therapist assisted with putting her heels in.  She was able to utilize the RUE as an active assist for pulling up pants with occasional min assist and increased time.    Therapy Documentation Precautions:  Precautions Precautions: Fall Precaution Comments: right hemiparesis, expressive  difficulty Restrictions Weight Bearing Restrictions: No  Pain: Pain Assessment Pain Assessment: No/denies pain ADL: See FIM for current functional status  Therapy/Group: Individual Therapy  Vaishnav Demartin OTR/L 06/10/2013, 11:46 AM

## 2013-06-10 NOTE — Progress Notes (Signed)
Speech Language Pathology Daily Session & Weekly Progress Notes  Patient Details  Name: Stacie [name] MRN: [id number] Date of Birth: 28-Jan-1941  Today's Date: 06/10/2013 Time: 8657-8469 Time Calculation (min): 44 min  Short Term Goals: Week 2: SLP Short Term Goal 1 (Week 2): Patient will tolerate regular textures and thin liquids with Min verbal cues to utilize safe swallow strategies. SLP Short Term Goal 2 (Week 2): Patient will verbalize basic wants/needs with Min questioning cues. SLP Short Term Goal 3 (Week 2): Patient will solve functional, complex problem solving in a understandable context with Mod verbal cues. SLP Short Term Goal 4 (Week 2): Patient will utilize external aids to recall basic, information with Mod verbal and questioning cues.  Skilled Therapeutic Interventions: Skilled treatment session focused on addressing cognitive-linguistic goals.  Interpreter present for session; SLP facilitated session with medication management task.  Patient required Supervision cues to accurately utilize medication external aid and Mod cues to accurately load medication organizer.  Patient started to get up during session and following questioning was able to state that she needed to go to the bathroom.  SLP re-educated on need to request help prior to getting up; patient did not verbalize understanding despite her ability to comprehend the information.  SLP assist with transfer to and from, as well as toileting with Min assist with the exception of one loss of balance where SLP assist with Mod assist.  Continue with current plan of care.   FIM:  Comprehension Comprehension Mode: Auditory Comprehension: 5-Follows basic conversation/direction: With extra time/assistive device Expression Expression Mode: Verbal Expression: 5-Expresses basic needs/ideas: With extra time/assistive device Social Interaction Social Interaction: 4-Interacts appropriately 75 - 89% of the time -  Needs redirection for appropriate language or to initiate interaction. Problem Solving Problem Solving: 5-Solves basic 90% of the time/requires cueing < 10% of the time Memory Memory: 5-Requires cues to use assistive device  Pain Pain Assessment Pain Assessment: No/denies pain  Therapy/Group: Individual Therapy   Speech Language Pathology Weekly Progress Note  Patient Details  Name: Stacie [name] MRN: [id number] Date of Birth: 1941-04-25  Today's Date: 06/10/2013  Short Term Goals: Week 2: SLP Short Term Goal 1 (Week 2): Patient will tolerate regular textures and thin liquids with Min verbal cues to utilize safe swallow strategies. SLP Short Term Goal 1 - Progress (Week 2): Met SLP Short Term Goal 2 (Week 2): Patient will verbalize basic wants/needs with Min questioning cues. SLP Short Term Goal 2 - Progress (Week 2): Met SLP Short Term Goal 3 (Week 2): Patient will solve functional, complex problem solving in a understandable context with Mod verbal cues. SLP Short Term Goal 3 - Progress (Week 2): Met SLP Short Term Goal 4 (Week 2): Patient will utilize external aids to recall basic, information with Mod verbal and questioning cues. SLP Short Term Goal 4 - Progress (Week 2): Met Week 3: SLP Short Term Goal 1 (Week 3): Patient will request help as needed with basic wants/needs with Supervision level questioning cues. SLP Short Term Goal 2 (Week 3): Patient will solve functional, complex problem solving with Min verbal cues. SLP Short Term Goal 3 (Week 3): Patient will utilize external aids to recall basic, information with Supervision level verbal/questioning cues.  Weekly Progress Updates: Patient met 4 out of 4 short term goals this reporting period.  Patient made gains in diet toleration with set-up assist for a few tasks, functional basic problem solving, initiation of verbal expression of wants and  needs and use of external memory aids.  She continues to  demonstrate difficulty with requesting help as needed due to decreased awareness as well as personality.  Additionally, she continues to demonstrate deficits with complex functional problem solving during financial and medication management tasks.  It is recommended that patient continue to receive skilled SLP services and continue to focus on functional tasks to maximize safety, increase functional independence, and reduce burden of care prior to discharge home with family.  SLP Intensity: Minumum of 1-2 x/day, 30 to 90 minutes SLP Frequency: 5 out of 7 days SLP Duration/Estimated Length of Stay: Discharge 06/16/13 SLP Treatment/Interventions: Cognitive remediation/compensation;Cueing hierarchy;Functional tasks;Internal/external aids;Medication managment;Patient/family education;Therapeutic Activities  Charlane Ferretti., CCC-SLP [phone]  [name] 06/10/2013, 11:33 AM

## 2013-06-10 NOTE — Progress Notes (Addendum)
Subjective/Complaints: No translator today.  Pt is smiling No problems noted.."dolor" points to R shoulder No pain complaints, tolerating therapy well.   Review of Systems - R shoulder pain improved, denies bowel bladder issues, denies breathing issues, remainder of review negative  Objective: Vital Signs: Blood pressure 125/73, pulse 76, temperature 98 F (36.7 C), temperature source Oral, resp. rate 17, weight 78.472 kg (173 lb), SpO2 96.00%. No results found. Results for orders placed during the hospital encounter of 05/24/13 (from the past 72 hour(s))  GLUCOSE, CAPILLARY     Status: Abnormal   Collection Time    06/07/13  7:40 AM      Result Value Range   Glucose-Capillary 203 (*) 70 - 99 mg/dL  GLUCOSE, CAPILLARY     Status: Abnormal   Collection Time    06/07/13 12:03 PM      Result Value Range   Glucose-Capillary 150 (*) 70 - 99 mg/dL  GLUCOSE, CAPILLARY     Status: Abnormal   Collection Time    06/07/13  4:18 PM      Result Value Range   Glucose-Capillary 110 (*) 70 - 99 mg/dL  GLUCOSE, CAPILLARY     Status: Abnormal   Collection Time    06/07/13  8:37 PM      Result Value Range   Glucose-Capillary 68 (*) 70 - 99 mg/dL  GLUCOSE, CAPILLARY     Status: None   Collection Time    06/07/13  8:58 PM      Result Value Range   Glucose-Capillary 81  70 - 99 mg/dL   Comment 1 Notify RN    GLUCOSE, CAPILLARY     Status: Abnormal   Collection Time    06/08/13  7:31 AM      Result Value Range   Glucose-Capillary 166 (*) 70 - 99 mg/dL  GLUCOSE, CAPILLARY     Status: Abnormal   Collection Time    06/08/13 11:41 AM      Result Value Range   Glucose-Capillary 199 (*) 70 - 99 mg/dL  GLUCOSE, CAPILLARY     Status: Abnormal   Collection Time    06/08/13  4:46 PM      Result Value Range   Glucose-Capillary 140 (*) 70 - 99 mg/dL  GLUCOSE, CAPILLARY     Status: Abnormal   Collection Time    06/08/13  8:44 PM      Result Value Range   Glucose-Capillary 69 (*) 70 - 99 mg/dL    Comment 1 Notify RN    GLUCOSE, CAPILLARY     Status: Abnormal   Collection Time    06/08/13  9:13 PM      Result Value Range   Glucose-Capillary 113 (*) 70 - 99 mg/dL   Comment 1 Notify RN    GLUCOSE, CAPILLARY     Status: Abnormal   Collection Time    06/09/13  7:38 AM      Result Value Range   Glucose-Capillary 202 (*) 70 - 99 mg/dL   Comment 1 Notify RN    GLUCOSE, CAPILLARY     Status: Abnormal   Collection Time    06/09/13 11:28 AM      Result Value Range   Glucose-Capillary 148 (*) 70 - 99 mg/dL  GLUCOSE, CAPILLARY     Status: Abnormal   Collection Time    06/09/13  4:34 PM      Result Value Range   Glucose-Capillary 120 (*) 70 - 99 mg/dL  GLUCOSE,  CAPILLARY     Status: Abnormal   Collection Time    06/09/13  8:38 PM      Result Value Range   Glucose-Capillary 178 (*) 70 - 99 mg/dL   Comment 1 Notify RN       HEENT: normal Cardio: RRR and no murmur Resp: CTA B/L and unlabored GI: BS positive and non distended Extremity:  Pulses positive and No Edema Skin:   Intact Neuro: Alert/Oriented, Cranial Nerve Abnormalities R central 7,, Abnormal Sensory reduced sensory on Right side and Abnormal Motor 4-/5 in RUE, 3 minus/5 right hip flexor knee extensor 2 minus/5 ankle dorsiflexor and plantar flexed 5/5 in the left upper and left lower limb Musc/Skel: Mild/mod pain shoulder ROM and to palpation R AC joint, no swelling or erythema Gen NAD   Assessment/Plan: 1. 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 Physiatrist is providing close team supervision and 24 hour management of active medical problems listed below. Physiatrist and rehab team continue to assess barriers to discharge/monitor patient progress toward functional and medical goals.  FIM: FIM - Bathing Bathing Steps Patient Completed: Chest;Right  Arm;Abdomen;Right upper leg;Left upper leg;Left lower leg (including foot);Left Arm;Right lower leg (including foot) Bathing: 4: Min-Patient completes 8-9 54f 10 parts or 75+ percent  FIM - Upper Body Dressing/Undressing Upper body dressing/undressing steps patient completed: Thread/unthread left sleeve of pullover shirt/dress;Put head through opening of pull over shirt/dress;Thread/unthread right sleeve of pullover shirt/dresss;Pull shirt over trunk Upper body dressing/undressing: 5: Set-up assist to: Obtain clothing/put away FIM - Lower Body Dressing/Undressing Lower body dressing/undressing steps patient completed: Thread/unthread left pants leg;Pull pants up/down;Don/Doff right sock;Thread/unthread right pants leg;Don/Doff left sock;Don/Doff left shoe;Fasten/unfasten right shoe Lower body dressing/undressing: 3: Mod-Patient completed 50-74% of tasks  FIM - Toileting Toileting steps completed by patient: Adjust clothing prior to toileting;Performs perineal hygiene;Adjust clothing after toileting Toileting Assistive Devices: Grab bar or rail for support Toileting: 5: Supervision: Safety issues/verbal cues  FIM - Diplomatic Services operational officer Devices: Elevated toilet seat;Grab bars Toilet Transfers: 4-To toilet/BSC: Min A (steadying Pt. > 75%);4-From toilet/BSC: Min A (steadying Pt. > 75%)  FIM - Bed/Chair Transfer Bed/Chair Transfer Assistive Devices: Bed rails Bed/Chair Transfer: 3: Supine > Sit: Mod A (lifting assist/Pt. 50-74%/lift 2 legs  FIM - Locomotion: Wheelchair Distance: Pt unable to attempt due to LEs not able to reach floor.  Locomotion: Wheelchair: 1: Total Assistance/staff pushes wheelchair (Pt<25%) FIM - Locomotion: Ambulation Locomotion: Ambulation Assistive Devices: Occupational hygienist Ambulation/Gait Assistance: 4: Min guard Locomotion: Ambulation: 2: Travels 50 - 149 ft with minimal assistance (Pt.>75%)  Comprehension Comprehension Mode:  Auditory Comprehension: 5-Follows basic conversation/direction: With extra time/assistive device  Expression Expression Mode: Verbal;Not assessed Expression: 5-Expresses basic needs/ideas: With extra time/assistive device  Social Interaction Social Interaction Mode: Asleep Social Interaction: 4-Interacts appropriately 75 - 89% of the time - Needs redirection for appropriate language or to initiate interaction.  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 DVT Prophylaxis/Anticoagulation: Pharmaceutical: Lovenox  2. Pain Management: Will continue prn tramadol shoulder pain, voltren gel for AC joint  3. Mood: difficulty to ascertain with language as well as language barrier. Family  to assist with input. LCSW to follow for evaluation. Affect is bright,  4. Neuropsych: This patient is  capable of making decisions on her own behalf.  5. HTN: Will monitor with bid checks. 6. DM type 2: amaryl and SSI Monitor BS with ac/hs checks and use SSI for elevated BS. Titrate as indicated for BS control. improving control increase metformin 850 mg twice a day, monitor 7. H/o angina: Will continue Ranexa. Negative cath reported this summer 8. Iron deficiency anemia: will add iron supplement. Check Vit B12 levels.  9.  Urinary retention and proteus UTI, completed Keflex for 7 days,on urecholine and flomax, starting to void, continue intermittent catheterization for post void residuals of greater than 300 mL     LOS (Days) 17 A FACE TO FACE EVALUATION WAS PERFORMED  KIRSTEINS,ANDREW E 06/10/2013, 7:04 AM

## 2013-06-10 NOTE — Progress Notes (Signed)
Occupational Therapy Session Note  Patient Details  Name: Stacie [name] MRN: [id number] Date of Birth: 03-Sep-1940  Today's Date: 06/10/2013 Time: 5621-3086 Time Calculation (min): 32 min  Skilled Therapeutic Interventions/Progress Updates:    Pt transferred from bed to wheelchair with min assist.  Rolled pt down to the gym and worked on [employer] functional use.  Had pt stand with min guard assist while using the RUE to place medium sized pegs in peg board placed above eye level.  Pt with occasional drops using the RUE but overall pt able to pick up one at a time and place with 90% accuracy.  Progressed to removing pegs, one at a time but manipulating one into the palm of the hand while picking up a second.  No drops during this activity.    Therapy Documentation Precautions:  Precautions Precautions: Fall Precaution Comments: right hemiparesis, expressive difficulty Restrictions Weight Bearing Restrictions: No  Pain: Pain Assessment Pain Assessment: No/denies pain  See FIM for current functional status  Therapy/Group: Individual Therapy  Ronne Stefanski OTR/L 06/10/2013, 2:57 PM

## 2013-06-11 ENCOUNTER — Inpatient Hospital Stay (HOSPITAL_COMMUNITY): Payer: Medicaid Other

## 2013-06-11 ENCOUNTER — Encounter (HOSPITAL_COMMUNITY): Payer: Self-pay | Admitting: Occupational Therapy

## 2013-06-11 ENCOUNTER — Inpatient Hospital Stay (HOSPITAL_COMMUNITY): Payer: Self-pay | Admitting: Rehabilitation

## 2013-06-11 ENCOUNTER — Inpatient Hospital Stay (HOSPITAL_COMMUNITY): Payer: Self-pay | Admitting: Occupational Therapy

## 2013-06-11 DIAGNOSIS — G8110 Spastic hemiplegia affecting unspecified side: Secondary | ICD-10-CM

## 2013-06-11 DIAGNOSIS — I10 Essential (primary) hypertension: Secondary | ICD-10-CM

## 2013-06-11 DIAGNOSIS — I6330 Cerebral infarction due to thrombosis of unspecified cerebral artery: Secondary | ICD-10-CM

## 2013-06-11 DIAGNOSIS — E1165 Type 2 diabetes mellitus with hyperglycemia: Secondary | ICD-10-CM

## 2013-06-11 LAB — URINALYSIS, ROUTINE W REFLEX MICROSCOPIC
Bilirubin Urine: NEGATIVE
Glucose, UA: NEGATIVE mg/dL
Specific Gravity, Urine: 1.014 (ref 1.005–1.030)
Urobilinogen, UA: 1 mg/dL (ref 0.0–1.0)
pH: 5 (ref 5.0–8.0)

## 2013-06-11 LAB — GLUCOSE, CAPILLARY
Glucose-Capillary: 160 mg/dL — ABNORMAL HIGH (ref 70–99)
Glucose-Capillary: 139 mg/dL — ABNORMAL HIGH (ref 70–99)
Glucose-Capillary: 106 mg/dL — ABNORMAL HIGH (ref 70–99)
Glucose-Capillary: 176 mg/dL — ABNORMAL HIGH (ref 70–99)

## 2013-06-11 LAB — URINE MICROSCOPIC-ADD ON

## 2013-06-11 MED ORDER — CEPHALEXIN 250 MG PO CAPS
250.0000 mg | ORAL_CAPSULE | Freq: Four times a day (QID) | ORAL | Status: DC
  Administered 2013-06-11 – 2013-06-13 (×8): 250 mg via ORAL
  Filled 2013-06-11 (×12): qty 1

## 2013-06-11 NOTE — Progress Notes (Signed)
Occupational Therapy Session Note  Patient Details  Name: Stacie [name] MRN: [id number] Date of Birth: 05-18-1941  Today's Date: 06/11/2013 Time: 7829-5621 Time Calculation (min): 45 min  Short Term Goals: Week 3:  OT Short Term Goal 1 (Week 3): Pt will perform all bathing with supervision sit to stand using AE PRN. OT Short Term Goal 2 (Week 3): Pt will perform LB dressing sit to stand with AE and min assist level. OT Short Term Goal 3 (Week 3): Pt will perform all aspects of toileting with supervision including toilet transfer. OT Short Term Goal 4 (Week 3): Pt will perform tub/shower transfer with supervision level. OT Short Term Goal 5 (Week 3): Pt will use the RUE as an active assist to tie her shoes with supervision.    Skilled Therapeutic Interventions/Progress Updates:    Pt transferred to the gym via wheelchair to work on RUE coordination activities.  Began having pt perform large ball toss using bilateral UEs in sitting.  Utilized beach ball having pt catch it and then try to toss it back with an overhead motion.  Pt still with decreased ability to achieve full overhead shoulder extension with elbow extension to toss the ball.  Transitioned to picking up large pegs one at a time until 2 were in her hand.  Then manipulating them to her finger tips and placing them into the peg board.  Pt needed mod instructional cueing to follow instructions.  Pt progressed to using smaller pegs and placing them in the peg board as well.  Still demonstrates decreased ability to manipulate smaller objects from the palm of her hand out to her fingertips.    Therapy Documentation Precautions:  Precautions Precautions: Fall Precaution Comments: right hemiparesis, expressive difficulty Restrictions Weight Bearing Restrictions: No  Pain: Pain Assessment Pain Assessment: No/denies pain ADL: See FIM for current functional status  Therapy/Group: Individual Therapy  Adalina Dopson  OTR/L 06/11/2013, 3:36 PM

## 2013-06-11 NOTE — Progress Notes (Signed)
Social Work Patient ID: Stacie [name], female   DOB: 15-Jan-1941, 72 y.o.   MRN: [id number] Spoke with Efraim Kaufmann who reports no one is able to stay with on Wed if she goes home that day.  They took off on Friday.  Discussed going home Thurs after therapies. Will ask permission from MD and see if team can work one more day with pt.

## 2013-06-11 NOTE — Progress Notes (Signed)
Physical Therapy Session Note  Patient Details  Name: Stacie [name] MRN: [id number] Date of Birth: June 24, 1941  Today's Date: 06/11/2013 Time: 0930-1015 Time Calculation (min): 45 min  Short Term Goals: Week 3:  PT Short Term Goal 1 (Week 3): Pt will perform bed mobility at min assist consistently with HOB flat PT Short Term Goal 2 (Week 3): Pt will perform dynamic standing balance with min assist consistently to perform functional activities PT Short Term Goal 3 (Week 3): Pt will ambulate x 100' w/ LRAD with supervision and min cues for technique.  PT Short Term Goal 4 (Week 3): Pt will ascend/descend 3 stairs with B handrails at min assist to simulate home entry  Skilled Therapeutic Interventions/Progress Updates:   Pt received sitting in w/c in room, stating she needs to use restroom.  Interpreter present during session to assist with translation.  Pt ambulated to/from restroom with min/guard assist with use of LBQC this morning.  Note per interpreter she has UTI and they are now measuring urine.  She also seems to be reaching more for other furniture in order to balance herself, however she tends to grab onto items that move.  Pt able to adjust clothing prior to and following toileting, however assisted with peri care today in order to ensure fully clean.  Assisted to gym to perform floor transfer.  Pt very resistant to performing this, however provided max education for need to practice in rehab setting in case of fall at home.  Also educated on when to call 911 for assistance and when to attempt to get up from floor.  Pt unwilling to get to floor from mat, therefore provided incremental steps to bump down to in order to get to floor.  Once in long sitting on floor, provided demonstration and max verbal cues for proper technique of getting into SL, then into quadruped, then tall kneeling then up on RLE, up to mat.  She requires mod/max assist in order to achieve quadruped from  prone, positioning assist for RLE, assist with elevating RLE and also to elevate hips into standing in order to get to mat.  Note she continues to have increased R ankle weakness and demonstrates some ankle eversion during activity.  Provided max cues on ensuring proper placement of foot prior to sitting.  Worked on side stepping R and L in order to address hip abd strength, weight shifting and increased weight bearing through RLE.  Able to perform with single UE support.  Also performed backwards walking x 10' with single UE support.  Continues to require increased cues for increased step lengths bilaterally.  Attempted to perform standing balance on foam pad to address lack of ankle and hip strategy, however once on foam pad, pt refused to attempt balance and assisted off of foam pad.  Assisted back to room and left in w/c with call bell in reach and interpreter also present.    Therapy Documentation Precautions:  Precautions Precautions: Fall Precaution Comments: right hemiparesis, expressive difficulty Restrictions Weight Bearing Restrictions: No   Pain: Pt continues to state pain in L knee, therefore allowed several rests breaks during session.  Locomotion : Ambulation Ambulation/Gait Assistance: 4: Min guard   See FIM for current functional status  Therapy/Group: Individual Therapy  Vista Deck 06/11/2013, 10:49 AM

## 2013-06-11 NOTE — Progress Notes (Signed)
Subjective/Complaints: No translator today. Voided twice but still with 351ml+ residual, foul smelling urine Review of Systems - R shoulder pain improved, denies bowel bladder issues, denies breathing issues, remainder of review negative  Objective: Vital Signs: Blood pressure 110/57, pulse 81, temperature 98.7 F (37.1 C), temperature source Oral, resp. rate 16, weight 78.472 kg (173 lb), SpO2 96.00%. No results found. Results for orders placed during the hospital encounter of 05/24/13 (from the past 72 hour(s))  GLUCOSE, CAPILLARY     Status: Abnormal   Collection Time    06/08/13 11:41 AM      Result Value Range   Glucose-Capillary 199 (*) 70 - 99 mg/dL  GLUCOSE, CAPILLARY     Status: Abnormal   Collection Time    06/08/13  4:46 PM      Result Value Range   Glucose-Capillary 140 (*) 70 - 99 mg/dL  GLUCOSE, CAPILLARY     Status: Abnormal   Collection Time    06/08/13  8:44 PM      Result Value Range   Glucose-Capillary 69 (*) 70 - 99 mg/dL   Comment 1 Notify RN    GLUCOSE, CAPILLARY     Status: Abnormal   Collection Time    06/08/13  9:13 PM      Result Value Range   Glucose-Capillary 113 (*) 70 - 99 mg/dL   Comment 1 Notify RN    GLUCOSE, CAPILLARY     Status: Abnormal   Collection Time    06/09/13  7:38 AM      Result Value Range   Glucose-Capillary 202 (*) 70 - 99 mg/dL   Comment 1 Notify RN    GLUCOSE, CAPILLARY     Status: Abnormal   Collection Time    06/09/13 11:28 AM      Result Value Range   Glucose-Capillary 148 (*) 70 - 99 mg/dL  GLUCOSE, CAPILLARY     Status: Abnormal   Collection Time    06/09/13  4:34 PM      Result Value Range   Glucose-Capillary 120 (*) 70 - 99 mg/dL  GLUCOSE, CAPILLARY     Status: Abnormal   Collection Time    06/09/13  8:38 PM      Result Value Range   Glucose-Capillary 178 (*) 70 - 99 mg/dL   Comment 1 Notify RN    GLUCOSE, CAPILLARY     Status: Abnormal   Collection Time    06/10/13  7:30 AM      Result Value Range   Glucose-Capillary 161 (*) 70 - 99 mg/dL  GLUCOSE, CAPILLARY     Status: None   Collection Time    06/10/13 12:10 PM      Result Value Range   Glucose-Capillary 79  70 - 99 mg/dL  GLUCOSE, CAPILLARY     Status: Abnormal   Collection Time    06/10/13  5:41 PM      Result Value Range   Glucose-Capillary 141 (*) 70 - 99 mg/dL   Comment 1 Notify RN    GLUCOSE, CAPILLARY     Status: Abnormal   Collection Time    06/10/13  9:06 PM      Result Value Range   Glucose-Capillary 64 (*) 70 - 99 mg/dL   Comment 1 Notify RN    GLUCOSE, CAPILLARY     Status: None   Collection Time    06/10/13 10:19 PM      Result Value Range   Glucose-Capillary 75  70 -  99 mg/dL   Comment 1 Notify RN    GLUCOSE, CAPILLARY     Status: Abnormal   Collection Time    06/11/13  7:26 AM      Result Value Range   Glucose-Capillary 160 (*) 70 - 99 mg/dL   Comment 1 Notify RN       HEENT: normal Cardio: RRR and no murmur Resp: CTA B/L and unlabored GI: BS positive and non distended Extremity:  Pulses positive and No Edema Skin:   Intact Neuro: Alert/Oriented, Cranial Nerve Abnormalities R central 7,, Abnormal Sensory reduced sensory on Right side and Abnormal Motor 4-/5 in RUE, 3 minus/5 right hip flexor knee extensor 2 minus/5 ankle dorsiflexor and plantar flexed 5/5 in the left upper and left lower limb Musc/Skel: Mild/mod pain shoulder ROM and to palpation R AC joint, no swelling or erythema Gen NAD   Assessment/Plan: 1. 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 Physiatrist is providing close team supervision and 24 hour management of active medical problems listed below. Physiatrist and rehab team continue to assess barriers to discharge/monitor patient progress toward functional and medical goals.  FIM: FIM - Bathing Bathing Steps Patient  Completed: Chest;Right Arm;Abdomen;Right upper leg;Left upper leg;Left lower leg (including foot);Left Arm;Right lower leg (including foot);Front perineal area Bathing: 4: Min-Patient completes 8-9 50f 10 parts or 75+ percent  FIM - Upper Body Dressing/Undressing Upper body dressing/undressing steps patient completed: Thread/unthread left sleeve of pullover shirt/dress;Put head through opening of pull over shirt/dress;Thread/unthread right sleeve of pullover shirt/dresss;Pull shirt over trunk Upper body dressing/undressing: 5: Set-up assist to: Obtain clothing/put away FIM - Lower Body Dressing/Undressing Lower body dressing/undressing steps patient completed: Thread/unthread left pants leg;Pull pants up/down;Thread/unthread right pants leg;Don/Doff left sock;Don/Doff left shoe;Fasten/unfasten right shoe;Thread/unthread right underwear leg;Thread/unthread left underwear leg;Pull underwear up/down;Fasten/unfasten left shoe Lower body dressing/undressing: 4: Min-Patient completed 75 plus % of tasks  FIM - Toileting Toileting steps completed by patient: Performs perineal hygiene;Adjust clothing prior to toileting Toileting Assistive Devices: Grab bar or rail for support Toileting: 3: Mod-Patient completed 2 of 3 steps  FIM - Diplomatic Services operational officer Devices: Elevated toilet seat;Grab bars Toilet Transfers: 4-To toilet/BSC: Min A (steadying Pt. > 75%);4-From toilet/BSC: Min A (steadying Pt. > 75%)  FIM - Bed/Chair Transfer Bed/Chair Transfer Assistive Devices: Bed rails Bed/Chair Transfer: 4: Supine > Sit: Min A (steadying Pt. > 75%/lift 1 leg);4: Chair or W/C > Bed: Min A (steadying Pt. > 75%)  FIM - Locomotion: Wheelchair Distance: Pt unable to attempt due to LEs not able to reach floor.  Locomotion: Wheelchair: 1: Total Assistance/staff pushes wheelchair (Pt<25%) FIM - Locomotion: Ambulation Locomotion: Ambulation Assistive Devices: Occupational hygienist Ambulation/Gait Assistance:  4: Min guard Locomotion: Ambulation: 4: Travels 150 ft or more with minimal assistance (Pt.>75%)  Comprehension Comprehension Mode: Auditory Comprehension: 5-Understands complex 90% of the time/Cues < 10% of the time  Expression Expression Mode: Verbal Expression: 4-Expresses basic 75 - 89% of the time/requires cueing 10 - 24% of the time. Needs helper to occlude trach/needs to repeat words.  Social Interaction Social Interaction Mode: Asleep Social Interaction: 4-Interacts appropriately 75 - 89% of the time - Needs redirection for appropriate language or to initiate interaction.  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 DVT Prophylaxis/Anticoagulation: Pharmaceutical: Lovenox  2. Pain Management: Will continue prn tramadol shoulder pain, voltren gel for AC joint  3. Mood: difficulty to ascertain with language as well as language barrier. Family to assist with input. LCSW to follow for evaluation. Affect is bright,  4. Neuropsych: This patient is  capable of making decisions on her own behalf.  5. HTN: Will monitor with bid checks. 6. DM type 2: amaryl and SSI Monitor BS with ac/hs checks and use SSI for elevated BS. Titrate as indicated for BS control. improving control increase metformin 850 mg twice a day, monitor 7. H/o angina: Will continue Ranexa. Negative cath reported this summer 8. Iron deficiency anemia: will add iron supplement. Check Vit B12 levels.  9.  Urinary retention and proteus UTI, completed Keflex for 7 days,on urecholine and flomax, starting to void, continue intermittent catheterization for post void residuals of greater than 300 mL, suspect recurrence will  recheck UA C and S, restart Keflex    LOS (Days) 18 A FACE TO FACE EVALUATION WAS PERFORMED  KIRSTEINS,ANDREW E 06/11/2013, 8:20 AM

## 2013-06-11 NOTE — Progress Notes (Signed)
Speech Language Pathology Daily Session Note  Patient Details  Name: Stacie [name] MRN: [id number] Date of Birth: 1941-06-19  Today's Date: 06/11/2013 Time: 1015-1100 Time Calculation (min): 45 min  Short Term Goals: Week 3: SLP Short Term Goal 1 (Week 3): Patient will request help as needed with basic wants/needs with Supervision level questioning cues. SLP Short Term Goal 2 (Week 3): Patient will solve functional, complex problem solving with Min verbal cues. SLP Short Term Goal 3 (Week 3): Patient will utilize external aids to recall basic, information with Supervision level verbal/questioning cues.  Skilled Therapeutic Interventions: Skilled treatment session focused on addressing cognitive-linguistic goals. Interpreter present for session; SLP facilitated session with medication management task. Patient required Min cues to accurately utilize medication external aid and Mod cues to accurately load medication organizer. Continue plan of care.   FIM:  Comprehension Comprehension Mode: Auditory Comprehension: 5-Follows basic conversation/direction: With extra time/assistive device Expression Expression Mode: Verbal Expression: 5-Expresses basic needs/ideas: With extra time/assistive device Social Interaction Social Interaction: 4-Interacts appropriately 75 - 89% of the time - Needs redirection for appropriate language or to initiate interaction. Problem Solving Problem Solving: 5-Solves basic 90% of the time/requires cueing < 10% of the time Memory Memory: 4-Recognizes or recalls 75 - 89% of the time/requires cueing 10 - 24% of the time  Pain Pain Assessment Pain Assessment: No/denies pain  Therapy/Group: Individual Therapy   Maxcine Ham, M.A. CCC-SLP [phone]   Maxcine Ham 06/11/2013, 11:16 AM

## 2013-06-11 NOTE — Progress Notes (Signed)
Occupational Therapy Session Note  Patient Details  Name: Stacie [name] MRN: [id number] Date of Birth: Jul 25, 1940  Today's Date: 06/11/2013 Time: 0800-0856 Time Calculation (min): 56 min  Short Term Goals: Week 3:  OT Short Term Goal 1 (Week 3): Pt will perform all bathing with supervision sit to stand using AE PRN. OT Short Term Goal 2 (Week 3): Pt will perform LB dressing sit to stand with AE and min assist level. OT Short Term Goal 3 (Week 3): Pt will perform all aspects of toileting with supervision including toilet transfer. OT Short Term Goal 4 (Week 3): Pt will perform tub/shower transfer with supervision level. OT Short Term Goal 5 (Week 3): Pt will use the RUE as an active assist to tie her shoes with supervision.    Skilled Therapeutic Interventions/Progress Updates:    Pt ambulated to the shower with her quad cane and min assist level.  Performed bathing sit to stand with min assist as well.  Needed min instructional cueing to wash her peri area and was unable to reach her buttocks so therapist had to assist.  Pt also attempted to stand with soap on her feet so needed mod instructional cueing to rinse soap off before attempting to stand.  Pt transferred back to the wheelchair in front of the sink with min assist.  Dressing overall min assist except for donning shoe over the right foot, which she needed mod assist.  Utilized sockaide and reacher to assist with socks and shoes.  Min assist for sit to stand at the sink with mod instructional cueing for hand placement as pt attempts to pull up on the sink to stand.  Therapy Documentation Precautions:  Precautions Precautions: Fall Precaution Comments: right hemiparesis, expressive difficulty Restrictions Weight Bearing Restrictions: No  Pain: Pain Assessment Pain Assessment: No/denies pain ADL: See FIM for current functional status  Therapy/Group: Individual Therapy  Lula Kolton OTR/L 06/11/2013, 12:27  PM

## 2013-06-12 ENCOUNTER — Inpatient Hospital Stay (HOSPITAL_COMMUNITY): Payer: Medicaid Other | Admitting: Physical Therapy

## 2013-06-12 DIAGNOSIS — E1165 Type 2 diabetes mellitus with hyperglycemia: Secondary | ICD-10-CM

## 2013-06-12 DIAGNOSIS — I6330 Cerebral infarction due to thrombosis of unspecified cerebral artery: Secondary | ICD-10-CM

## 2013-06-12 DIAGNOSIS — G8110 Spastic hemiplegia affecting unspecified side: Secondary | ICD-10-CM

## 2013-06-12 LAB — GLUCOSE, CAPILLARY: Glucose-Capillary: 88 mg/dL (ref 70–99)

## 2013-06-12 NOTE — Progress Notes (Addendum)
Subjective/Complaints: Slept well. Had bm yesterday. One high pvr Review of Systems - limited due to language.  R shoulder pain improved, denies bowel bladder issues, denies breathing issues, remainder of review negative  Objective: Vital Signs: Blood pressure 115/66, pulse 78, temperature 98 F (36.7 C), temperature source Oral, resp. rate 16, height 5' 2.99" (1.6 m), weight 78.472 kg (173 lb), SpO2 94.00%. No results found. Results for orders placed during the hospital encounter of 05/24/13 (from the past 72 hour(s))  GLUCOSE, CAPILLARY     Status: Abnormal   Collection Time    06/09/13 11:28 AM      Result Value Range   Glucose-Capillary 148 (*) 70 - 99 mg/dL  GLUCOSE, CAPILLARY     Status: Abnormal   Collection Time    06/09/13  4:34 PM      Result Value Range   Glucose-Capillary 120 (*) 70 - 99 mg/dL  GLUCOSE, CAPILLARY     Status: Abnormal   Collection Time    06/09/13  8:38 PM      Result Value Range   Glucose-Capillary 178 (*) 70 - 99 mg/dL   Comment 1 Notify RN    GLUCOSE, CAPILLARY     Status: Abnormal   Collection Time    06/10/13  7:30 AM      Result Value Range   Glucose-Capillary 161 (*) 70 - 99 mg/dL  GLUCOSE, CAPILLARY     Status: None   Collection Time    06/10/13 12:10 PM      Result Value Range   Glucose-Capillary 79  70 - 99 mg/dL  GLUCOSE, CAPILLARY     Status: Abnormal   Collection Time    06/10/13  5:41 PM      Result Value Range   Glucose-Capillary 141 (*) 70 - 99 mg/dL   Comment 1 Notify RN    GLUCOSE, CAPILLARY     Status: Abnormal   Collection Time    06/10/13  9:06 PM      Result Value Range   Glucose-Capillary 64 (*) 70 - 99 mg/dL   Comment 1 Notify RN    GLUCOSE, CAPILLARY     Status: None   Collection Time    06/10/13 10:19 PM      Result Value Range   Glucose-Capillary 75  70 - 99 mg/dL   Comment 1 Notify RN    GLUCOSE, CAPILLARY     Status: Abnormal   Collection Time    06/11/13  7:26 AM      Result Value Range    Glucose-Capillary 160 (*) 70 - 99 mg/dL   Comment 1 Notify RN    URINALYSIS, ROUTINE W REFLEX MICROSCOPIC     Status: Abnormal   Collection Time    06/11/13  7:51 AM      Result Value Range   Color, Urine YELLOW  YELLOW   APPearance CLOUDY (*) CLEAR   Specific Gravity, Urine 1.014  1.005 - 1.030   pH 5.0  5.0 - 8.0   Glucose, UA NEGATIVE  NEGATIVE mg/dL   Hgb urine dipstick MODERATE (*) NEGATIVE   Bilirubin Urine NEGATIVE  NEGATIVE   Ketones, ur NEGATIVE  NEGATIVE mg/dL   Protein, ur NEGATIVE  NEGATIVE mg/dL   Urobilinogen, UA 1.0  0.0 - 1.0 mg/dL   Nitrite NEGATIVE  NEGATIVE   Leukocytes, UA LARGE (*) NEGATIVE  URINE MICROSCOPIC-ADD ON     Status: Abnormal   Collection Time    06/11/13  7:51 AM  Result Value Range   Squamous Epithelial / LPF RARE  RARE   WBC, UA TOO NUMEROUS TO COUNT  <3 WBC/hpf   RBC / HPF 3-6  <3 RBC/hpf   Bacteria, UA MANY (*) RARE  GLUCOSE, CAPILLARY     Status: Abnormal   Collection Time    06/11/13 11:46 AM      Result Value Range   Glucose-Capillary 139 (*) 70 - 99 mg/dL   Comment 1 Notify RN    GLUCOSE, CAPILLARY     Status: Abnormal   Collection Time    06/11/13  4:51 PM      Result Value Range   Glucose-Capillary 106 (*) 70 - 99 mg/dL   Comment 1 Notify RN    GLUCOSE, CAPILLARY     Status: Abnormal   Collection Time    06/11/13  9:06 PM      Result Value Range   Glucose-Capillary 176 (*) 70 - 99 mg/dL     HEENT: normal Cardio: RRR and no murmur Resp: CTA B/L and unlabored GI: BS positive and non distended Extremity:  Pulses positive and No Edema Skin:   Intact Neuro: Alert/Oriented, Cranial Nerve Abnormalities R central 7,, Abnormal Sensory reduced sensory on Right side and Abnormal Motor 4-/5 in RUE, 3 minus/5 right hip flexor knee extensor 2 minus/5 ankle dorsiflexor and plantar flexed 5/5 in the left upper and left lower limb Musc/Skel: Mild/mod pain shoulder ROM and to palpation R AC joint, no swelling or erythema Gen  NAD   Assessment/Plan: 1. 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 Physiatrist is providing close team supervision and 24 hour management of active medical problems listed below. Physiatrist and rehab team continue to assess barriers to discharge/monitor patient progress toward functional and medical goals.  FIM: FIM - Bathing Bathing Steps Patient Completed: Chest;Right Arm;Abdomen;Right upper leg;Left upper leg;Left lower leg (including foot);Left Arm;Right lower leg (including foot);Front perineal area Bathing: 4: Min-Patient completes 8-9 35f 10 parts or 75+ percent  FIM - Upper Body Dressing/Undressing Upper body dressing/undressing steps patient completed: Thread/unthread left sleeve of pullover shirt/dress;Put head through opening of pull over shirt/dress;Thread/unthread right sleeve of pullover shirt/dresss;Pull shirt over trunk Upper body dressing/undressing: 5: Set-up assist to: Obtain clothing/put away FIM - Lower Body Dressing/Undressing Lower body dressing/undressing steps patient completed: Thread/unthread left pants leg;Pull pants up/down;Thread/unthread right pants leg;Don/Doff left sock;Don/Doff left shoe;Fasten/unfasten right shoe;Fasten/unfasten left shoe;Thread/unthread right underwear leg;Thread/unthread left underwear leg;Don/Doff right sock Lower body dressing/undressing: 4: Min-Patient completed 75 plus % of tasks  FIM - Toileting Toileting steps completed by patient: Performs perineal hygiene;Adjust clothing prior to toileting Toileting Assistive Devices: Grab bar or rail for support Toileting: 3: Mod-Patient completed 2 of 3 steps  FIM - Diplomatic Services operational officer Devices: Elevated toilet seat;Grab bars Toilet Transfers: 4-To toilet/BSC: Min A (steadying Pt. > 75%);4-From toilet/BSC: Min A  (steadying Pt. > 75%)  FIM - Bed/Chair Transfer Bed/Chair Transfer Assistive Devices: Bed rails Bed/Chair Transfer: 4: Supine > Sit: Min A (steadying Pt. > 75%/lift 1 leg);4: Bed > Chair or W/C: Min A (steadying Pt. > 75%)  FIM - Locomotion: Wheelchair Distance: Pt unable to attempt due to LEs not able to reach floor.  Locomotion: Wheelchair: 1: Total Assistance/staff pushes wheelchair (Pt<25%) FIM - Locomotion: Ambulation Locomotion: Ambulation Assistive Devices: Occupational hygienist Ambulation/Gait Assistance: 4: Min guard Locomotion: Ambulation: 1: [employer]  less than 50 ft with minimal assistance (Pt.>75%)  Comprehension Comprehension Mode: Auditory Comprehension: 5-Follows basic conversation/direction: With extra time/assistive device  Expression Expression Mode: Verbal Expression: 5-Expresses basic needs/ideas: With extra time/assistive device  Social Interaction Social Interaction Mode: Asleep Social Interaction: 4-Interacts appropriately 75 - 89% of the time - Needs redirection for appropriate language or to initiate interaction.  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 DVT Prophylaxis/Anticoagulation: Pharmaceutical: Lovenox  2. Pain Management: Will continue prn tramadol shoulder pain, voltren gel for AC joint  3. Mood: difficulty to ascertain with language as well as language barrier. Family to assist with input. LCSW to follow for evaluation. Affect is bright,  4. Neuropsych: This patient is  capable of making decisions on her own behalf.  5. HTN: Will monitor with bid checks. 6. DM type 2: amaryl and SSI Monitor BS with ac/hs checks and use SSI for elevated BS. Titrate as indicated  for BS control. improving control   recently increased metformin 850 mg twice a day  7. H/o angina: Will continue Ranexa. Negative cath reported this summer 8. Iron deficiency anemia:  iron supplement. Check Vit B12 levels.  9.  Urinary retention and proteus UTI, completed Keflex for 7 days,on urecholine and flomax, starting to void, continue intermittent catheterization for post void residuals of greater than 300 mL, s  -repeat UA positive, culture pending  -begin empiric abx--keflex already started by primary team--willl continue for now pending culture   LOS (Days) 19 A FACE TO FACE EVALUATION WAS PERFORMED  Parker Sawatzky T 06/12/2013, 8:27 AM

## 2013-06-12 NOTE — Progress Notes (Signed)
Physical Therapy Session Note  Patient Details  Name: Stacie [name] MRN: [id number] Date of Birth: Apr 20, 1941  Today's Date: 06/12/2013 Time: 0807-0907 Time Calculation (min): 60 min  Short Term Goals: Week 1:  PT Short Term Goal 1 (Week 1): Pt will perform bed mobility at mod assist consistently with HOB flat PT Short Term Goal 1 - Progress (Week 1): Progressing toward goal PT Short Term Goal 2 (Week 1): Pt will perform squat pivot transfer at max assist R or L with safe technique PT Short Term Goal 2 - Progress (Week 1): Met PT Short Term Goal 3 (Week 1): Pt will perform dynamic sitting balance at min assist PT Short Term Goal 3 - Progress (Week 1): Met PT Short Term Goal 4 (Week 1): Pt will perform standing balance with max assist of one person to perform functional activities PT Short Term Goal 4 - Progress (Week 1): Met PT Short Term Goal 5 (Week 1): Pt will ambulate x 20' w/ LRAD with max assist (+2 for chair follow) PT Short Term Goal 5 - Progress (Week 1): Met  Skilled Therapeutic Interventions/Progress Updates:  Pt was seen bedside in the am with interpreter to assist with translation. Pt transferred supine to edge of bed with side rail, head of bed elevated and min A. At edge of bed, donned underwear and pants, then stood with min A to pull them up. Pt ambulated from edge of bed to sink with LBQC and min A, at sink brushed teeth with min guard to min A. Pt tolerated standing about 3 minutes with min guard. Pt ambulated with [facility] and min guard about 80 feet. Pt preformed car transfers with min to mod A and LBQC. Interpreter will find out from family what kind of car will be used at discharge to assist with preparation for discharge. Pt had an episode of incontinence. Pt returned to room. Pt transferred w/c to edge of bed with min A. Pt transferred edge of bed to supine with min A. Interpreter assisted with verbal cues throughout tx.   Therapy  Documentation Precautions:  Precautions Precautions: Fall Precaution Comments: right hemiparesis, expressive difficulty Restrictions Weight Bearing Restrictions: No General:   Pain: No c/o pain.    Locomotion : Ambulation Ambulation/Gait Assistance: 4: Min guard   See FIM for current functional status  Therapy/Group: Individual Therapy  [name] 06/12/2013, 12:28 PM

## 2013-06-13 ENCOUNTER — Inpatient Hospital Stay (HOSPITAL_COMMUNITY): Payer: Medicaid Other | Admitting: Physical Therapy

## 2013-06-13 LAB — GLUCOSE, CAPILLARY
Glucose-Capillary: 158 mg/dL — ABNORMAL HIGH (ref 70–99)
Glucose-Capillary: 116 mg/dL — ABNORMAL HIGH (ref 70–99)
Glucose-Capillary: 152 mg/dL — ABNORMAL HIGH (ref 70–99)

## 2013-06-13 LAB — URINE CULTURE: Colony Count: >=100000

## 2013-06-13 MED ORDER — CIPROFLOXACIN HCL 250 MG PO TABS
250.0000 mg | ORAL_TABLET | Freq: Two times a day (BID) | ORAL | Status: DC
  Administered 2013-06-13 – 2013-06-14 (×3): 250 mg via ORAL
  Filled 2013-06-13 (×5): qty 1

## 2013-06-13 NOTE — Progress Notes (Signed)
Subjective/Complaints: Slept well. Had bm yesterday. One high pvr Review of Systems - limited due to language.  R shoulder pain improved, denies bowel bladder issues, denies breathing issues, remainder of review negative  Objective: Vital Signs: Blood pressure 125/66, pulse 68, temperature 97.9 F (36.6 C), temperature source Oral, resp. rate 16, height 5' 2.99" (1.6 m), weight 78.472 kg (173 lb), SpO2 95.00%. No results found. Results for orders placed during the hospital encounter of 05/24/13 (from the past 72 hour(s))  GLUCOSE, CAPILLARY     Status: None   Collection Time    06/10/13 12:10 PM      Result Value Range   Glucose-Capillary 79  70 - 99 mg/dL  GLUCOSE, CAPILLARY     Status: Abnormal   Collection Time    06/10/13  5:41 PM      Result Value Range   Glucose-Capillary 141 (*) 70 - 99 mg/dL   Comment 1 Notify RN    GLUCOSE, CAPILLARY     Status: Abnormal   Collection Time    06/10/13  9:06 PM      Result Value Range   Glucose-Capillary 64 (*) 70 - 99 mg/dL   Comment 1 Notify RN    GLUCOSE, CAPILLARY     Status: None   Collection Time    06/10/13 10:19 PM      Result Value Range   Glucose-Capillary 75  70 - 99 mg/dL   Comment 1 Notify RN    GLUCOSE, CAPILLARY     Status: Abnormal   Collection Time    06/11/13  7:26 AM      Result Value Range   Glucose-Capillary 160 (*) 70 - 99 mg/dL   Comment 1 Notify RN    URINALYSIS, ROUTINE W REFLEX MICROSCOPIC     Status: Abnormal   Collection Time    06/11/13  7:51 AM      Result Value Range   Color, Urine YELLOW  YELLOW   APPearance CLOUDY (*) CLEAR   Specific Gravity, Urine 1.014  1.005 - 1.030   pH 5.0  5.0 - 8.0   Glucose, UA NEGATIVE  NEGATIVE mg/dL   Hgb urine dipstick MODERATE (*) NEGATIVE   Bilirubin Urine NEGATIVE  NEGATIVE   Ketones, ur NEGATIVE  NEGATIVE mg/dL   Protein, ur NEGATIVE  NEGATIVE mg/dL   Urobilinogen, UA 1.0  0.0 - 1.0 mg/dL   Nitrite NEGATIVE  NEGATIVE   Leukocytes, UA LARGE (*) NEGATIVE   URINE CULTURE     Status: None   Collection Time    06/11/13  7:51 AM      Result Value Range   Specimen Description URINE, CATHETERIZED     Special Requests NONE     Culture  Setup Time       Value: 06/11/2013 09:17     Performed at [facility] Count       Value: >=100,000 COLONIES/ML     Performed at [facility]   Culture       Value: ESCHERICHIA COLI     Performed at [facility]   Report Status PENDING    URINE MICROSCOPIC-ADD ON     Status: Abnormal   Collection Time    06/11/13  7:51 AM      Result Value Range   Squamous Epithelial / LPF RARE  RARE   WBC, UA TOO NUMEROUS TO COUNT  <3 WBC/hpf   RBC / HPF 3-6  <3 RBC/hpf  Bacteria, UA MANY (*) RARE  GLUCOSE, CAPILLARY     Status: Abnormal   Collection Time    06/11/13 11:46 AM      Result Value Range   Glucose-Capillary 139 (*) 70 - 99 mg/dL   Comment 1 Notify RN    GLUCOSE, CAPILLARY     Status: Abnormal   Collection Time    06/11/13  4:51 PM      Result Value Range   Glucose-Capillary 106 (*) 70 - 99 mg/dL   Comment 1 Notify RN    GLUCOSE, CAPILLARY     Status: Abnormal   Collection Time    06/11/13  9:06 PM      Result Value Range   Glucose-Capillary 176 (*) 70 - 99 mg/dL  GLUCOSE, CAPILLARY     Status: Abnormal   Collection Time    06/12/13  7:34 AM      Result Value Range   Glucose-Capillary 167 (*) 70 - 99 mg/dL   Comment 1 Notify RN    GLUCOSE, CAPILLARY     Status: Abnormal   Collection Time    06/12/13 11:45 AM      Result Value Range   Glucose-Capillary 146 (*) 70 - 99 mg/dL   Comment 1 Notify RN    GLUCOSE, CAPILLARY     Status: Abnormal   Collection Time    06/12/13  4:29 PM      Result Value Range   Glucose-Capillary 140 (*) 70 - 99 mg/dL   Comment 1 Notify RN    GLUCOSE, CAPILLARY     Status: None   Collection Time    06/12/13  9:00 PM      Result Value Range   Glucose-Capillary 88  70 - 99 mg/dL   Comment 1 Notify RN    GLUCOSE, CAPILLARY      Status: Abnormal   Collection Time    06/13/13  7:09 AM      Result Value Range   Glucose-Capillary 158 (*) 70 - 99 mg/dL   Comment 1 Notify RN       HEENT: normal Cardio: RRR and no murmur Resp: CTA B/L and unlabored GI: BS positive and non distended Extremity:  Pulses positive and No Edema Skin:   Intact Neuro: Alert/Oriented, Cranial Nerve Abnormalities R central 7,, Abnormal Sensory reduced sensory on Right side and Abnormal Motor 4-/5 in RUE, 3 minus/5 right hip flexor knee extensor 2 minus/5 ankle dorsiflexor and plantar flexed 5/5 in the left upper and left lower limb Musc/Skel: Mild/mod pain shoulder ROM and to palpation R AC joint, no swelling or erythema Gen NAD   Assessment/Plan: 1. 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 Physiatrist is providing close team supervision and 24 hour management of active medical problems listed below. Physiatrist and rehab team continue to assess barriers to discharge/monitor patient progress toward functional and medical goals.  FIM: FIM - Bathing Bathing Steps Patient Completed: Chest;Right Arm;Abdomen;Right upper leg;Left upper leg;Left lower leg (including foot);Left Arm;Right lower leg (including foot);Front perineal area Bathing: 4: Min-Patient completes 8-9 34f 10 parts or 75+ percent  FIM - Upper Body Dressing/Undressing Upper body dressing/undressing steps patient completed: Thread/unthread left sleeve of pullover shirt/dress;Put head through opening of pull over shirt/dress;Thread/unthread right sleeve of pullover shirt/dresss;Pull shirt over trunk Upper body dressing/undressing: 5: Set-up assist to: Obtain clothing/put away FIM - Lower Body  Dressing/Undressing Lower body dressing/undressing steps patient completed: Thread/unthread left pants leg;Pull pants  up/down;Thread/unthread right pants leg;Don/Doff left sock;Don/Doff left shoe;Fasten/unfasten right shoe;Fasten/unfasten left shoe;Thread/unthread right underwear leg;Thread/unthread left underwear leg;Don/Doff right sock Lower body dressing/undressing: 4: Min-Patient completed 75 plus % of tasks  FIM - Toileting Toileting steps completed by patient: Performs perineal hygiene;Adjust clothing prior to toileting Toileting Assistive Devices: Grab bar or rail for support Toileting: 3: Mod-Patient completed 2 of 3 steps  FIM - Diplomatic Services operational officer Devices: Elevated toilet seat;Grab bars Toilet Transfers: 4-To toilet/BSC: Min A (steadying Pt. > 75%);4-From toilet/BSC: Min A (steadying Pt. > 75%)  FIM - Bed/Chair Transfer Bed/Chair Transfer Assistive Devices: Bed rails;Arm rests;HOB elevated Bed/Chair Transfer: 4: Supine > Sit: Min A (steadying Pt. > 75%/lift 1 leg);4: Sit > Supine: Min A (steadying pt. > 75%/lift 1 leg);4: Chair or W/C > Bed: Min A (steadying Pt. > 75%);4: Bed > Chair or W/C: Min A (steadying Pt. > 75%)  FIM - Locomotion: Wheelchair Distance: Pt unable to attempt due to LEs not able to reach floor.  Locomotion: Wheelchair: 1: Total Assistance/staff pushes wheelchair (Pt<25%) FIM - Locomotion: Ambulation Locomotion: Ambulation Assistive Devices: Occupational hygienist Ambulation/Gait Assistance: 4: Min guard Locomotion: Ambulation: 2: Travels 50 - 149 ft with minimal assistance (Pt.>75%)  Comprehension Comprehension Mode: Auditory Comprehension: 5-Follows basic conversation/direction: With no assist  Expression Expression Mode: Verbal Expression: 5-Expresses basic needs/ideas: With extra time/assistive device  Social Interaction Social Interaction Mode: Asleep Social Interaction: 4-Interacts appropriately 75 - 89% of the time - Needs redirection for appropriate language or to initiate interaction.  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 DVT Prophylaxis/Anticoagulation: Pharmaceutical: Lovenox  2. Pain Management: Will continue prn tramadol shoulder pain, voltren gel for AC joint  3. Mood: difficulty to ascertain with language as well as language barrier. Family to assist with input. LCSW to follow for evaluation. Affect is bright,  4. Neuropsych: This patient is  capable of making decisions on her own behalf.  5. HTN: Will monitor with bid checks. 6. DM type 2: amaryl and SSI Monitor BS with ac/hs checks and use SSI for elevated BS. Titrate as indicated for BS control. improved control   recently increased metformin 850 mg twice a day  7. H/o angina: Will continue Ranexa. Negative cath reported this summer 8. Iron deficiency anemia:  iron supplement.   Vit B12 level normal.  9.  Urinary retention and proteus UTI, completed Keflex for 7 days,on urecholine and flomax, starting to void, continue intermittent catheterization for post void residuals of greater than 300 mL, s  -repeat UA positive, culture now with e coli-  -will change to cipro pending culture sens   LOS (Days) 20 A FACE TO FACE EVALUATION WAS PERFORMED  Rollo Farquhar T 06/13/2013, 8:06 AM

## 2013-06-13 NOTE — Progress Notes (Signed)
Physical Therapy Session Note  Patient Details  Name: Stacie [name] MRN: [id number] Date of Birth: 05-Feb-1941  Today's Date: 06/13/2013 Time: 4098-1191 Time Calculation (min): 40 min  Short Term Goals: Week 1:  PT Short Term Goal 1 (Week 1): Pt will perform bed mobility at mod assist consistently with HOB flat PT Short Term Goal 1 - Progress (Week 1): Progressing toward goal PT Short Term Goal 2 (Week 1): Pt will perform squat pivot transfer at max assist R or L with safe technique PT Short Term Goal 2 - Progress (Week 1): Met PT Short Term Goal 3 (Week 1): Pt will perform dynamic sitting balance at min assist PT Short Term Goal 3 - Progress (Week 1): Met PT Short Term Goal 4 (Week 1): Pt will perform standing balance with max assist of one person to perform functional activities PT Short Term Goal 4 - Progress (Week 1): Met PT Short Term Goal 5 (Week 1): Pt will ambulate x 20' w/ LRAD with max assist (+2 for chair follow) PT Short Term Goal 5 - Progress (Week 1): Met  Skilled Therapeutic Interventions/Progress Updates:   Pt was seen bedside in the am. Upon entering room, as per interpreter pt c/o 10/10 in vaginal area, nursing obtained pain medicine. Pt willing to attempt to participate with therapy. Pt transferred supine to edge of bed with side rail and min A. Pt transferred sit to stand with [facility] and min guard. Pt ambulated 200 feet, with [facility] and min guard. While ambulating, when pt asked if having any pain, pt denied c/o pain. Once in gyn, pt medicated by nursing. Pt transferred chair to w/c with min guard and LBQC. Pt's daughter requested to perform car transfers. Pt transferred from w/c to car, car to w/c with [facility] and min A. As per family pt will have to transfer into a toyota car at discharge. During transfer pt had an episode of incontinence, unable to complete training. Pt returned to room and nursing notified.   Therapy Documentation Precautions:   Precautions Precautions: Fall Precaution Comments: right hemiparesis, expressive difficulty Restrictions Weight Bearing Restrictions: No General:   Pain: As per interpreter, pt c/o 10/10 pain vaginal area, nursing medicated during treatment.    Locomotion : Ambulation Ambulation/Gait Assistance: 4: Min guard   See FIM for current functional status  Therapy/Group: Individual Therapy  [name] 06/13/2013, 12:42 PM

## 2013-06-14 ENCOUNTER — Encounter (HOSPITAL_COMMUNITY): Payer: Self-pay | Admitting: Occupational Therapy

## 2013-06-14 ENCOUNTER — Inpatient Hospital Stay (HOSPITAL_COMMUNITY): Payer: Self-pay | Admitting: Occupational Therapy

## 2013-06-14 ENCOUNTER — Inpatient Hospital Stay (HOSPITAL_COMMUNITY): Payer: Self-pay | Admitting: Rehabilitation

## 2013-06-14 ENCOUNTER — Inpatient Hospital Stay (HOSPITAL_COMMUNITY): Payer: Medicaid Other | Admitting: Speech Pathology

## 2013-06-14 LAB — GLUCOSE, CAPILLARY
Glucose-Capillary: 153 mg/dL — ABNORMAL HIGH (ref 70–99)
Glucose-Capillary: 142 mg/dL — ABNORMAL HIGH (ref 70–99)
Glucose-Capillary: 110 mg/dL — ABNORMAL HIGH (ref 70–99)
Glucose-Capillary: 196 mg/dL — ABNORMAL HIGH (ref 70–99)

## 2013-06-14 LAB — BASIC METABOLIC PANEL
Chloride: 101 mEq/L (ref 96–112)
Creatinine, Ser: 0.8 mg/dL (ref 0.50–1.10)
GFR calc Af Amer: 83 mL/min — ABNORMAL LOW (ref >90)
GFR calc non Af Amer: 72 mL/min — ABNORMAL LOW (ref >90)
Potassium: 4.5 mEq/L (ref 3.5–5.1)
Sodium: 139 mEq/L (ref 135–145)

## 2013-06-14 MED ORDER — PIPERACILLIN-TAZOBACTAM 3.375 G IVPB
3.3750 g | Freq: Three times a day (TID) | INTRAVENOUS | Status: DC
  Filled 2013-06-14 (×2): qty 50

## 2013-06-14 MED ORDER — PIPERACILLIN-TAZOBACTAM 3.375 G IVPB 30 MIN
3.3750 g | Freq: Once | INTRAVENOUS | Status: DC
  Filled 2013-06-14 (×2): qty 50

## 2013-06-14 MED ORDER — FOSFOMYCIN TROMETHAMINE 3 G PO PACK
3.0000 g | PACK | ORAL | Status: AC
  Administered 2013-06-14 – 2013-06-17 (×2): 3 g via ORAL
  Filled 2013-06-14 (×2): qty 3

## 2013-06-14 NOTE — Progress Notes (Signed)
Physical Therapy Session Note  Patient Details  Name: Stacie [name] MRN: [id number] Date of Birth: Dec 17, 1940  Today's Date: 06/14/2013 Time: 0930-1015 Time Calculation (min): 45 min  Short Term Goals: Week 3:  PT Short Term Goal 1 (Week 3): Pt will perform bed mobility at min assist consistently with HOB flat PT Short Term Goal 2 (Week 3): Pt will perform dynamic standing balance with min assist consistently to perform functional activities PT Short Term Goal 3 (Week 3): Pt will ambulate x 100' w/ LRAD with supervision and min cues for technique.  PT Short Term Goal 4 (Week 3): Pt will ascend/descend 3 stairs with B handrails at min assist to simulate home entry  Skilled Therapeutic Interventions/Progress Updates:   Pt received sitting in w/c in room with interpreter present during session to assist with translation.  Ambulated to/from gym with [facility] at supervision level. See details below.  Once in gym, pt states she needs to use restroom, therefore ambulated to ADL apt with [facility] at supervision level with increased cues for increased R step length due to carpeted surface.  She was able to maneuver door on her own with mod cues for safety.  Also provided cues for keeping cane with her until all the way at toilet surface.  She continues to require some assist for adjusting clothing on R side due to decreased grasp in R hand. Pt able to void (urine) in toilet and perform peri care.  Ambulated back into hallway to perform high level gait/balance activity with braiding (grapevine) and tandem walking.  See below.  Pt continues to require max encouragement to participate in high level balance activities.  Provided max education that she continues to be a fall risk and would benefit from improved balance before D/C home.  Pt ambulated back to room and left in w/c with all needs in reach.    Therapy Documentation Precautions:  Precautions Precautions: Fall Precaution Comments: right  hemiparesis, expressive difficulty Restrictions Weight Bearing Restrictions: No   Pain: Pain Assessment Pain Assessment: No/denies pain   Locomotion : Ambulation Ambulation: Yes Ambulation/Gait Assistance: 5: Supervision Ambulation Distance (Feet): 100 Feet (x 2 reps) Assistive device: Large base quad cane Ambulation/Gait Assistance Details: Verbal cues for precautions/safety;Verbal cues for gait pattern;Manual facilitation for weight shifting;Manual facilitation for placement;Manual facilitation for weight bearing;Tactile cues for initiation;Tactile cues for posture Ambulation/Gait Assistance Details: Continue to gait train with [facility] for safer use to carryover to home/community ambulation.  Pt able to ambulate today at supervision level (close) with continued cues for upright posture and increased step length on RLE (however is better than last week).  Note pt continues to have mild LOB with increased amount of distractions.   Gait Gait: Yes Gait Pattern: Impaired Gait Pattern: Decreased stance time - right;Decreased hip/knee flexion - right;Decreased dorsiflexion - right;Decreased weight shift to right;Step-through pattern High Level Ambulation High Level Ambulation: Other high level ambulation High Level Ambulation - Other Comments: Performed grapevine walking x 30' with LE crossing over initially then crossing behind.  Also performed 20' x 1 with tandem walking.  she requires min assist for higher level dynamic balance activities.    See FIM for current functional status  Therapy/Group: Individual Therapy  Vista Deck 06/14/2013, 12:24 PM

## 2013-06-14 NOTE — Progress Notes (Signed)
Social Work Patient ID: Stacie [name], female   DOB: 1940-10-30, 72 y.o.   MRN: [id number] MD ok with pt staying until Thurs and working on her goals.  Team aware and moved on [employer]. Pt also informed she thought she was going on Wed.

## 2013-06-14 NOTE — Progress Notes (Signed)
Occupational Therapy Session Note  Patient Details  Name: Stacie [name] MRN: [id number] Date of Birth: 14-May-1941  Today's Date: 06/14/2013 Time: 8657-8469 Time Calculation (min): 45 min   Skilled Therapeutic Interventions/Progress Updates:    Began session by taking pt down to the ADL apartment and practicing a tub/shower transfer using the tub bench.  Pt able to perform using the quad cane with min guard assist.  Transitioned to the gym for RUE exercises and strengthening tasks.  Had pt perform 3 sets of shoulder flexion using 2lb dowel rod and min instructional cueing for technique.  Pt also performed 2 sets of elbow flexion bilaterally for 15 reps.  Progressed to having pt perform ball catch and toss with medium sized ball using both hands.  Incorporated lifting ball overhead to toss it back.  Pt demonstrating difficulty achieving full elbow extension and shoulder flexion in the RUE during task.  Attempted to have pt catch and toss small tennis ball but demonstrated frequent drops approximately 75 % of the time.  Finished session by having pt push her wheelchair out in the hall with emphasis on RUE strengthening.  Pt with moderate difficulty making the wheelchair go straight.    Therapy Documentation Precautions:  Precautions Precautions: Fall Precaution Comments: right hemiparesis, expressive difficulty Restrictions Weight Bearing Restrictions: No  Pain: Pain Assessment Pain Assessment: No/denies pain ADL: See FIM for current functional status  Therapy/Group: Individual Therapy  Haivyn Oravec OTR/L 06/14/2013, 3:07 PM

## 2013-06-14 NOTE — Plan of Care (Signed)
Problem: RH BLADDER ELIMINATION Goal: RH STG MANAGE BLADDER WITH ASSISTANCE STG Manage Bladder With total Assistance- I&O cath by caregiver  Pt can be continent during the day, but is occasionally incontinent @ hs and wears a brief

## 2013-06-14 NOTE — Progress Notes (Signed)
Subjective/Complaints:  Review of Systems - R shoulder pain improved, denies bowel bladder issues, denies breathing issues, remainder of review negative  Objective: Vital Signs: Blood pressure 122/71, pulse 69, temperature 97.8 F (36.6 C), temperature source Oral, resp. rate 16, height 5' 2.99" (1.6 m), weight 78.472 kg (173 lb), SpO2 96.00%. No results found. Results for orders placed during the hospital encounter of 05/24/13 (from the past 72 hour(s))  GLUCOSE, CAPILLARY     Status: Abnormal   Collection Time    06/11/13  7:26 AM      Result Value Range   Glucose-Capillary 160 (*) 70 - 99 mg/dL   Comment 1 Notify RN    URINALYSIS, ROUTINE W REFLEX MICROSCOPIC     Status: Abnormal   Collection Time    06/11/13  7:51 AM      Result Value Range   Color, Urine YELLOW  YELLOW   APPearance CLOUDY (*) CLEAR   Specific Gravity, Urine 1.014  1.005 - 1.030   pH 5.0  5.0 - 8.0   Glucose, UA NEGATIVE  NEGATIVE mg/dL   Hgb urine dipstick MODERATE (*) NEGATIVE   Bilirubin Urine NEGATIVE  NEGATIVE   Ketones, ur NEGATIVE  NEGATIVE mg/dL   Protein, ur NEGATIVE  NEGATIVE mg/dL   Urobilinogen, UA 1.0  0.0 - 1.0 mg/dL   Nitrite NEGATIVE  NEGATIVE   Leukocytes, UA LARGE (*) NEGATIVE  URINE CULTURE     Status: None   Collection Time    06/11/13  7:51 AM      Result Value Range   Specimen Description URINE, CATHETERIZED     Special Requests NONE     Culture  Setup Time       Value: 06/11/2013 09:17     Performed at [facility] Count       Value: >=100,000 COLONIES/ML     Performed at [facility]   Culture       Value: ESCHERICHIA COLI     Note: Confirmed Extended Spectrum Beta-Lactamase Producer (ESBL)     Performed at [facility]   Report Status 06/13/2013 FINAL     Organism ID, Bacteria ESCHERICHIA COLI    URINE MICROSCOPIC-ADD ON     Status: Abnormal   Collection Time    06/11/13  7:51 AM      Result Value Range   Squamous Epithelial / LPF RARE   RARE   WBC, UA TOO NUMEROUS TO COUNT  <3 WBC/hpf   RBC / HPF 3-6  <3 RBC/hpf   Bacteria, UA MANY (*) RARE  GLUCOSE, CAPILLARY     Status: Abnormal   Collection Time    06/11/13 11:46 AM      Result Value Range   Glucose-Capillary 139 (*) 70 - 99 mg/dL   Comment 1 Notify RN    GLUCOSE, CAPILLARY     Status: Abnormal   Collection Time    06/11/13  4:51 PM      Result Value Range   Glucose-Capillary 106 (*) 70 - 99 mg/dL   Comment 1 Notify RN    GLUCOSE, CAPILLARY     Status: Abnormal   Collection Time    06/11/13  9:06 PM      Result Value Range   Glucose-Capillary 176 (*) 70 - 99 mg/dL  GLUCOSE, CAPILLARY     Status: Abnormal   Collection Time    06/12/13  7:34 AM      Result Value Range  Glucose-Capillary 167 (*) 70 - 99 mg/dL   Comment 1 Notify RN    GLUCOSE, CAPILLARY     Status: Abnormal   Collection Time    06/12/13 11:45 AM      Result Value Range   Glucose-Capillary 146 (*) 70 - 99 mg/dL   Comment 1 Notify RN    GLUCOSE, CAPILLARY     Status: Abnormal   Collection Time    06/12/13  4:29 PM      Result Value Range   Glucose-Capillary 140 (*) 70 - 99 mg/dL   Comment 1 Notify RN    GLUCOSE, CAPILLARY     Status: None   Collection Time    06/12/13  9:00 PM      Result Value Range   Glucose-Capillary 88  70 - 99 mg/dL   Comment 1 Notify RN    GLUCOSE, CAPILLARY     Status: Abnormal   Collection Time    06/13/13  7:09 AM      Result Value Range   Glucose-Capillary 158 (*) 70 - 99 mg/dL   Comment 1 Notify RN    GLUCOSE, CAPILLARY     Status: Abnormal   Collection Time    06/13/13 11:47 AM      Result Value Range   Glucose-Capillary 116 (*) 70 - 99 mg/dL  GLUCOSE, CAPILLARY     Status: Abnormal   Collection Time    06/13/13  4:23 PM      Result Value Range   Glucose-Capillary 152 (*) 70 - 99 mg/dL   Comment 1 Notify RN    GLUCOSE, CAPILLARY     Status: None   Collection Time    06/13/13  8:49 PM      Result Value Range   Glucose-Capillary 73  70 - 99  mg/dL   Comment 1 Notify RN       HEENT: normal Cardio: RRR and no murmur Resp: CTA B/L and unlabored GI: BS positive and non distended Extremity:  Pulses positive and No Edema Skin:   Intact Neuro: Alert/Oriented, Cranial Nerve Abnormalities R central 7,, Abnormal Sensory reduced sensory on Right side and Abnormal Motor 4-/5 in RUE, 3 minus/5 right hip flexor knee extensor 2 minus/5 ankle dorsiflexor and plantar flexed 5/5 in the left upper and left lower limb Musc/Skel: Mild/mod pain shoulder ROM and to palpation R AC joint, no swelling or erythema Gen NAD   Assessment/Plan: 1. 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 Physiatrist is providing close team supervision and 24 hour management of active medical problems listed below. Physiatrist and rehab team continue to assess barriers to discharge/monitor patient progress toward functional and medical goals.  FIM: FIM - Bathing Bathing Steps Patient Completed: Chest;Right Arm;Abdomen;Right upper leg;Left upper leg;Left lower leg (including foot);Left Arm;Right lower leg (including foot);Front perineal area Bathing: 4: Min-Patient completes 8-9 73f 10 parts or 75+ percent  FIM - Upper Body Dressing/Undressing Upper body dressing/undressing steps patient completed: Thread/unthread left sleeve of pullover shirt/dress;Put head through opening of pull over shirt/dress;Thread/unthread right sleeve of pullover shirt/dresss;Pull shirt over trunk Upper body dressing/undressing: 5: Set-up assist to: Obtain clothing/put away FIM - Lower Body Dressing/Undressing Lower body dressing/undressing steps patient completed: Thread/unthread left pants leg;Pull pants up/down;Thread/unthread right pants leg;Don/Doff left sock;Don/Doff left shoe;Fasten/unfasten right shoe;Fasten/unfasten left  shoe;Thread/unthread right underwear leg;Thread/unthread left underwear leg;Don/Doff right sock Lower body  dressing/undressing: 4: Min-Patient completed 75 plus % of tasks  FIM - Toileting Toileting steps completed by patient: Performs perineal hygiene;Adjust clothing prior to toileting Toileting Assistive Devices: Grab bar or rail for support Toileting: 3: Mod-Patient completed 2 of 3 steps  FIM - Diplomatic Services operational officer Devices: Elevated toilet seat;Grab bars Toilet Transfers: 4-To toilet/BSC: Min A (steadying Pt. > 75%);4-From toilet/BSC: Min A (steadying Pt. > 75%)  FIM - Bed/Chair Transfer Bed/Chair Transfer Assistive Devices: Bed rails;Arm rests;HOB elevated Bed/Chair Transfer: 4: Supine > Sit: Min A (steadying Pt. > 75%/lift 1 leg)  FIM - Locomotion: Wheelchair Distance: Pt unable to attempt due to LEs not able to reach floor.  Locomotion: Wheelchair: 1: Total Assistance/staff pushes wheelchair (Pt<25%) FIM - Locomotion: Ambulation Locomotion: Ambulation Assistive Devices: Occupational hygienist Ambulation/Gait Assistance: 4: Min guard Locomotion: Ambulation: 4: Travels 150 ft or more with minimal assistance (Pt.>75%)  Comprehension Comprehension Mode: Auditory Comprehension: 5-Follows basic conversation/direction: With extra time/assistive device  Expression Expression Mode: Verbal Expression: 5-Expresses basic needs/ideas: With no assist  Social Interaction Social Interaction Mode: Asleep Social Interaction: 5-Interacts appropriately 90% of the time - Needs monitoring or encouragement for participation or interaction.  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 DVT Prophylaxis/Anticoagulation:  Pharmaceutical: Lovenox  2. Pain Management: Will continue prn tramadol shoulder pain, voltren gel for AC joint  3. Mood: difficulty to ascertain with language as well as language barrier. Family to assist with input. LCSW to follow for evaluation. Affect is bright,  4. Neuropsych: This patient is  capable of making decisions on her own behalf.  5. HTN: Will monitor with bid checks. 6. DM type 2: amaryl and SSI Monitor BS with ac/hs checks and use SSI for elevated BS. Titrate as indicated for BS control. improving control increase metformin 850 mg twice a day, monitor 7. H/o angina: Will continue Ranexa. Negative cath reported this summer 8. Iron deficiency anemia: will add iron supplement. Check Vit B12 levels.  9.  Urinary retention and e coli UTI, completed Keflex for 7 days, multi resistant, IV abx x 2 day then start Macrobid   LOS (Days) 21 A FACE TO FACE EVALUATION WAS PERFORMED  KIRSTEINS,ANDREW E 06/14/2013, 7:25 AM

## 2013-06-14 NOTE — Progress Notes (Signed)
72yo female has been on Keflex and ciprofloxacin for UTI, now C/S reports resistance to cipro/ampicillin and sensitive to pip/tazo.  Will start Zosyn 3.375g IV Q8H for CrCl ~60 ml/min and monitor.  Vernard Gambles, PharmD, BCPS 06/14/2013 7:38 AM

## 2013-06-14 NOTE — Progress Notes (Signed)
Speech Language Pathology Daily Session Note  Patient Details  Name: Stacie [name] MRN: [id number] Date of Birth: 1940/08/18  Today's Date: 06/14/2013 Time: 1035-1120 Time Calculation (min): 45 min  Short Term Goals: Week 3: SLP Short Term Goal 1 (Week 3): Patient will request help as needed with basic wants/needs with Supervision level questioning cues. SLP Short Term Goal 2 (Week 3): Patient will solve functional, complex problem solving with Min verbal cues. SLP Short Term Goal 3 (Week 3): Patient will utilize external aids to recall basic, information with Supervision level verbal/questioning cues.  Skilled Therapeutic Interventions: Skilled treatment session focused on addressing cognitive-linguistic goals. Interpreter present for session; SLP facilitated session with medication management task. Patient required Mod cues to recall information about medications without use of external aid and Mod cues to accurately load medication organizer. Patient also required Min verbal cues to solve basic money management tasks.  Continue plan of care.   FIM:  Comprehension Comprehension Mode: Auditory Comprehension: 5-Understands basic 90% of the time/requires cueing < 10% of the time Expression Expression Mode: Verbal Expression: 5-Expresses basic 90% of the time/requires cueing < 10% of the time. Social Interaction Social Interaction: 5-Interacts appropriately 90% of the time - Needs monitoring or encouragement for participation or interaction. Problem Solving Problem Solving: 5-Solves basic 90% of the time/requires cueing < 10% of the time Memory Memory: 3-Recognizes or recalls 50 - 74% of the time/requires cueing 25 - 49% of the time  Pain Pain Assessment Pain Assessment: No/denies pain  Therapy/Group: Individual Therapy  Charlane Ferretti., CCC-SLP [phone]  Stacie [name] 06/14/2013, 11:44 AM

## 2013-06-14 NOTE — Plan of Care (Signed)
Problem: RH Wheelchair Mobility Goal: LTG Patient will propel w/c in controlled environment (PT) LTG: Patient will propel wheelchair in controlled environment, # of feet with assist (PT)  Outcome: Not Applicable Date Met:  06/14/13 Pt will not have personal w/c to practice w/c mobility until day of D/C, therefore will D/C w/c goals at this time Goal: LTG Patient will propel w/c in home environment (PT) LTG: Patient will propel wheelchair in home environment, # of feet with assistance (PT).  Outcome: Not Applicable Date Met:  06/14/13 Pt will not have personal w/c to practice w/c mobility until day of D/C, therefore will D/C w/c goals at this time     Comments:  Upgraded most mobility goals to supervision level due to improved balance, strength, functional use of RLE/UE.

## 2013-06-14 NOTE — Progress Notes (Signed)
Occupational Therapy Session Note  Patient Details  Name: Stacie [name] MRN: [id number] Date of Birth: [birth year]  Today's Date: 06/14/2013 Time: 0800-0857 Time Calculation (min): 57 min  Short Term Goals: Week 3:  OT Short Term Goal 1 (Week 3): Pt will perform all bathing with supervision sit to stand using AE PRN. OT Short Term Goal 2 (Week 3): Pt will perform LB dressing sit to stand with AE and min assist level. OT Short Term Goal 3 (Week 3): Pt will perform all aspects of toileting with supervision including toilet transfer. OT Short Term Goal 4 (Week 3): Pt will perform tub/shower transfer with supervision level. OT Short Term Goal 5 (Week 3): Pt will use the RUE as an active assist to tie her shoes with supervision.    Skilled Therapeutic Interventions/Progress Updates:    Pt performed bathing and dressing during session.  She transferred to the tub bench with min guard assist using the quad cane.  Still needs mod instructional cueing to push up from the wheelchair arms during all transfers.  Pt with one LOB in the shower when attempting to stand while holding washcloth in one hand and LH sponge in the other.  Pt cued to sit when performing UB bathing to help reduce the increased risk of falling.  Transitioned back out to the wheelchair in front of the sink for dressing tasks.  Pt needed min questioning cues to recall that she needed to begin with dressing using the RLE instead of the left.  Pt also utilized reacher during session to thread pants and undergarment protector as well as sockaide.  Therapist had to assist with donning socks on sockaide but pt was able to then use it without assistance to place them on her feet.  Pt also required min assist to donn her right shoe but she was able to complete the left and fasten both of her shoe buttons.  Pt using the RUE spontaneously during session.  She even attempts to use the RUE to use the reacher as well, however lacks  enough strength and coordination to use efficiently.    Therapy Documentation Precautions:  Precautions Precautions: Fall Precaution Comments: right hemiparesis, expressive difficulty Restrictions Weight Bearing Restrictions: No  Pain: Pain Assessment Pain Assessment: No/denies pain ADL: See FIM for current functional status  Therapy/Group: Individual Therapy  Vicent Febles OTR/L 06/14/2013, 12:07 PM

## 2013-06-15 ENCOUNTER — Encounter (HOSPITAL_COMMUNITY): Payer: Self-pay | Admitting: Occupational Therapy

## 2013-06-15 ENCOUNTER — Inpatient Hospital Stay (HOSPITAL_COMMUNITY): Payer: Medicaid Other | Admitting: Speech Pathology

## 2013-06-15 ENCOUNTER — Inpatient Hospital Stay (HOSPITAL_COMMUNITY): Payer: Self-pay | Admitting: Rehabilitation

## 2013-06-15 DIAGNOSIS — I6330 Cerebral infarction due to thrombosis of unspecified cerebral artery: Secondary | ICD-10-CM

## 2013-06-15 DIAGNOSIS — I10 Essential (primary) hypertension: Secondary | ICD-10-CM

## 2013-06-15 DIAGNOSIS — E1165 Type 2 diabetes mellitus with hyperglycemia: Secondary | ICD-10-CM

## 2013-06-15 DIAGNOSIS — G8110 Spastic hemiplegia affecting unspecified side: Secondary | ICD-10-CM

## 2013-06-15 LAB — GLUCOSE, CAPILLARY: Glucose-Capillary: 157 mg/dL — ABNORMAL HIGH (ref 70–99)

## 2013-06-15 NOTE — Progress Notes (Addendum)
Subjective/Complaints:  Review of Systems - R shoulder pain improved, denies bowel bladder issues, denies breathing issues, remainder of review negative  Objective: Vital Signs: Blood pressure 122/54, pulse 68, temperature 98 F (36.7 C), temperature source Oral, resp. rate 18, height 5' 2.99" (1.6 m), weight 78.472 kg (173 lb), SpO2 98.00%. No results found. Results for orders placed during the hospital encounter of 05/24/13 (from the past 72 hour(s))  GLUCOSE, CAPILLARY     Status: Abnormal   Collection Time    06/12/13 11:45 AM      Result Value Range   Glucose-Capillary 146 (*) 70 - 99 mg/dL   Comment 1 Notify RN    GLUCOSE, CAPILLARY     Status: Abnormal   Collection Time    06/12/13  4:29 PM      Result Value Range   Glucose-Capillary 140 (*) 70 - 99 mg/dL   Comment 1 Notify RN    GLUCOSE, CAPILLARY     Status: None   Collection Time    06/12/13  9:00 PM      Result Value Range   Glucose-Capillary 88  70 - 99 mg/dL   Comment 1 Notify RN    GLUCOSE, CAPILLARY     Status: Abnormal   Collection Time    06/13/13  7:09 AM      Result Value Range   Glucose-Capillary 158 (*) 70 - 99 mg/dL   Comment 1 Notify RN    GLUCOSE, CAPILLARY     Status: Abnormal   Collection Time    06/13/13 11:47 AM      Result Value Range   Glucose-Capillary 116 (*) 70 - 99 mg/dL  GLUCOSE, CAPILLARY     Status: Abnormal   Collection Time    06/13/13  4:23 PM      Result Value Range   Glucose-Capillary 152 (*) 70 - 99 mg/dL   Comment 1 Notify RN    GLUCOSE, CAPILLARY     Status: None   Collection Time    06/13/13  8:49 PM      Result Value Range   Glucose-Capillary 73  70 - 99 mg/dL   Comment 1 Notify RN    GLUCOSE, CAPILLARY     Status: Abnormal   Collection Time    06/14/13  7:25 AM      Result Value Range   Glucose-Capillary 153 (*) 70 - 99 mg/dL  BASIC METABOLIC PANEL     Status: Abnormal   Collection Time    06/14/13 10:10 AM      Result Value Range   Sodium 139  135 - 145 mEq/L    Potassium 4.5  3.5 - 5.1 mEq/L   Chloride 101  96 - 112 mEq/L   CO2 26  19 - 32 mEq/L   Glucose, Bld 211 (*) 70 - 99 mg/dL   BUN 22  6 - 23 mg/dL   Creatinine, Ser 1.61  0.50 - 1.10 mg/dL   Calcium 9.6  8.4 - 09.6 mg/dL   GFR calc non Af Amer 72 (*) >90 mL/min   GFR calc Af Amer 83 (*) >90 mL/min   Comment: (NOTE)     The eGFR has been calculated using the CKD EPI equation.     This calculation has not been validated in all clinical situations.     eGFR's persistently <90 mL/min signify possible Chronic Kidney     Disease.  GLUCOSE, CAPILLARY     Status: Abnormal   Collection Time  06/14/13 11:40 AM      Result Value Range   Glucose-Capillary 142 (*) 70 - 99 mg/dL  GLUCOSE, CAPILLARY     Status: Abnormal   Collection Time    06/14/13  4:43 PM      Result Value Range   Glucose-Capillary 110 (*) 70 - 99 mg/dL  GLUCOSE, CAPILLARY     Status: Abnormal   Collection Time    06/14/13  8:57 PM      Result Value Range   Glucose-Capillary 196 (*) 70 - 99 mg/dL  GLUCOSE, CAPILLARY     Status: Abnormal   Collection Time    06/15/13  7:26 AM      Result Value Range   Glucose-Capillary 157 (*) 70 - 99 mg/dL     HEENT: normal Cardio: RRR and no murmur Resp: CTA B/L and unlabored GI: BS positive and non distended Extremity:  Pulses positive and No Edema Skin:   Intact Neuro: Alert/Oriented, Cranial Nerve Abnormalities R central 7,, Abnormal Sensory reduced sensory on Right side and Abnormal Motor 4-/5 in RUE, 3 minus/5 right hip flexor knee extensor 2 minus/5 ankle dorsiflexor and plantar flexed 5/5 in the left upper and left lower limb Musc/Skel: Mild/mod pain shoulder ROM and to palpation R AC joint, no swelling or erythema Gen NAD   Assessment/Plan: 1. 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 Physiatrist is providing  close team supervision and 24 hour management of active medical problems listed below. Physiatrist and rehab team continue to assess barriers to discharge/monitor patient progress toward functional and medical goals. Should be medically ready for D/C on Thurs 12/11 FIM: FIM - Bathing Bathing Steps Patient Completed: Chest;Right Arm;Abdomen;Right upper leg;Left upper leg;Left lower leg (including foot);Left Arm;Right lower leg (including foot);Front perineal area;Buttocks Bathing: 4: Steadying assist  FIM - Upper Body Dressing/Undressing Upper body dressing/undressing steps patient completed: Thread/unthread left sleeve of pullover shirt/dress;Put head through opening of pull over shirt/dress;Thread/unthread right sleeve of pullover shirt/dresss;Pull shirt over trunk Upper body dressing/undressing: 5: Set-up assist to: Obtain clothing/put away FIM - Lower Body Dressing/Undressing Lower body dressing/undressing steps patient completed: Thread/unthread left pants leg;Pull pants up/down;Thread/unthread right pants leg;Don/Doff left sock;Don/Doff left shoe;Fasten/unfasten right shoe;Fasten/unfasten left shoe;Don/Doff right sock Lower body dressing/undressing: 4: Min-Patient completed 75 plus % of tasks  FIM - Toileting Toileting steps completed by patient: Performs perineal hygiene;Adjust clothing prior to toileting Toileting Assistive Devices: Grab bar or rail for support Toileting: 3: Mod-Patient completed 2 of 3 steps  FIM - Diplomatic Services operational officer Devices: Elevated toilet seat;Grab bars Toilet Transfers: 5-To toilet/BSC: Supervision (verbal cues/safety issues);5-From toilet/BSC: Supervision (verbal cues/safety issues)  FIM - Bed/Chair Transfer Bed/Chair Transfer Assistive Devices: Bed rails;Arm rests;HOB elevated Bed/Chair Transfer: 4: Supine > Sit: Min A (steadying Pt. > 75%/lift 1 leg)  FIM - Locomotion: Wheelchair Distance: Pt unable to attempt due to LEs not able  to reach floor.  Locomotion: Wheelchair: 0: Activity did not occur FIM - Locomotion: Ambulation Locomotion: Ambulation Assistive Devices: Occupational hygienist Ambulation/Gait Assistance: 5: Supervision Locomotion: Ambulation: 4: Travels 150 ft or more with minimal assistance (Pt.>75%)  Comprehension Comprehension Mode: Auditory Comprehension: 5-Understands basic 90% of the time/requires cueing < 10% of the time  Expression Expression Mode: Verbal Expression: 5-Expresses basic needs/ideas: With extra time/assistive device  Social Interaction Social Interaction Mode: Asleep Social Interaction: 5-Interacts appropriately 90% of the time -  Needs monitoring or encouragement for participation or interaction.  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 DVT Prophylaxis/Anticoagulation: Pharmaceutical: Lovenox  2. Pain Management: Will continue prn tramadol shoulder pain, voltren gel for AC joint  3. Mood: difficulty to ascertain with language as well as language barrier. Family to assist with input. LCSW to follow for evaluation. Affect is bright,  4. Neuropsych: This patient is  capable of making decisions on her own behalf.  5. HTN: Will monitor with bid checks. 6. DM type 2: amaryl and SSI Monitor BS with ac/hs checks and use SSI for elevated BS. Titrate as indicated for BS control. improving control increase metformin 850 mg twice a day, monitor 7. H/o angina: Will continue Ranexa. Negative cath reported this summer 8. Iron deficiency anemia: will add iron supplement. Check Vit B12 levels.  9.  Urinary retention and e coli UTI,ESBL, switched to fosfomycin per pharmacy.  Received one dose 12/8 and will get  another dose prior to D/C   LOS (Days) 22 A FACE TO FACE EVALUATION WAS PERFORMED  Cecelia Graciano E 06/15/2013, 8:35 AM

## 2013-06-15 NOTE — Plan of Care (Signed)
Problem: RH Stairs Goal: LTG Patient will ambulate up and down stairs w/assist (PT) LTG: Patient will ambulate up and down # of stairs with assistance (PT)  Outcome: Not Applicable Date Met:  06/15/13 Pts family installed ramp, therefore will D/C stair goal and add ramp goal.

## 2013-06-15 NOTE — Progress Notes (Signed)
Occupational Therapy Session Note  Patient Details  Name: Stacie [name] MRN: [id number] Date of Birth: December 31, 1940  Today's Date: 06/15/2013 Time: 0800-0900 Time Calculation (min): 60 min  Short Term Goals: Week 3:  OT Short Term Goal 1 (Week 3): Pt will perform all bathing with supervision sit to stand using AE PRN. OT Short Term Goal 2 (Week 3): Pt will perform LB dressing sit to stand with AE and min assist level. OT Short Term Goal 3 (Week 3): Pt will perform all aspects of toileting with supervision including toilet transfer. OT Short Term Goal 4 (Week 3): Pt will perform tub/shower transfer with supervision level. OT Short Term Goal 5 (Week 3): Pt will use the RUE as an active assist to tie her shoes with supervision.    Skilled Therapeutic Interventions/Progress Updates:    Pt declined performing bathing this session and only wanted to perform UB dressing and donn her socks and shoes.  Pt's daughter and another caregiver present for session.  Discussed pt's progress with bathing and dressing tasks and pt was able to utilize the sockaide to donn her socks.  Integrated a stool for donning the right shoe and for fastening both.  Pt still needs assistance with placing her heel in the right shoe.  Practiced toilet transfer with the caregiver using the quad cane and gait belt.  Transitioned down to the tub room for education on tub shower transfers using the tub bench as well.  Pt able to perform with min assist.  Discussed getting a hand held shower for home and family states that they are putting in grab bars as well.  Also discussed removing all throw rugs from around the house too.    Therapy Documentation Precautions:  Precautions Precautions: Fall Precaution Comments: right hemiparesis, expressive difficulty Restrictions Weight Bearing Restrictions: No  Pain: Pain Assessment Pain Assessment: No/denies pain Pain Score: 2  ADL: See FIM for current functional  status  Therapy/Group: Individual Therapy  Kalyn Dimattia OTR/L 06/15/2013, 12:09 PM

## 2013-06-15 NOTE — Progress Notes (Signed)
Speech Language Pathology Daily Session Note  Patient Details  Name: Stacie [name] MRN: [id number] Date of Birth: [birth year]  Today's Date: 06/15/2013 Time: 4098-1191 Time Calculation (min): 41 min  Short Term Goals: Week 3: SLP Short Term Goal 1 (Week 3): Patient will request help as needed with basic wants/needs with Supervision level questioning cues. SLP Short Term Goal 2 (Week 3): Patient will solve functional, complex problem solving with Min verbal cues. SLP Short Term Goal 3 (Week 3): Patient will utilize external aids to recall basic, information with Supervision level verbal/questioning cues.  Skilled Therapeutic Interventions: Skilled treatment session focused on addressing cognitive-linguistic goals. Interpreter present for session as well as paid caregiver; SLP facilitated session with medication management task. Patient required Min cues to locate named times of week accurately on medication box.  SLP also facilitated session with updated medication recall chart, which interpreter translated onto written external aid.  SLP verbally educated caregiver on difference between verbal abilities and functional abilities and need to often anticipate needs and assist due to patient not recognizing when she needs assistance; caregiver verbalized understanding of information. Continue plan of care.   FIM:  Comprehension Comprehension Mode: Auditory Comprehension: 5-Understands basic 90% of the time/requires cueing < 10% of the time Expression Expression Mode: Verbal Expression: 5-Expresses basic needs/ideas: With extra time/assistive device Social Interaction Social Interaction: 5-Interacts appropriately 90% of the time - Needs monitoring or encouragement for participation or interaction. Problem Solving Problem Solving: 5-Solves basic 90% of the time/requires cueing < 10% of the time Memory Memory: 3-Recognizes or recalls 50 - 74% of the time/requires cueing 25 -  49% of the time  Pain Pain Assessment Pain Assessment: No/denies pain  Therapy/Group: Individual Therapy  Charlane Ferretti., CCC-SLP [phone]  Crystal Ellwood 06/15/2013, 11:25 AM

## 2013-06-15 NOTE — Progress Notes (Signed)
Physical Therapy Session Note  Patient Details  Name: Stacie [name] MRN: [id number] Date of Birth: February 11, 1941  Today's Date: 06/15/2013 Time: 0930-1028 Time Calculation (min): 58 min  Short Term Goals: Week 3:  PT Short Term Goal 1 (Week 3): Pt will perform bed mobility at min assist consistently with HOB flat PT Short Term Goal 2 (Week 3): Pt will perform dynamic standing balance with min assist consistently to perform functional activities PT Short Term Goal 3 (Week 3): Pt will ambulate x 100' w/ LRAD with supervision and min cues for technique.  PT Short Term Goal 4 (Week 3): Pt will ascend/descend 3 stairs with B handrails at min assist to simulate home entry  Skilled Therapeutic Interventions/Progress Updates:   Pt received sitting in w/c with interpreter present, as well as daytime caregiver.  Focus of session was family education/training in order to prepare for D/C home.  Pt lying in bed and agreeable to therapy.  Performed supine to SL to sit at min assist level.  Provided cues for caregiver on how to properly assist at pts hips/trunk if needed and not to allow pt to grab her for assist.  Pt able to ambulate to/from therapy gym, ortho gym, and ADL apt with [facility] and with intermittent seated rest breaks during tasks.  This PT initially ambulated with pt at supervision level with continued cues for upright posture and increased R step length, esp on carpeted surface.  Provided cues for caregiver on proper positioning when ambulating with pt, and need to provide hands on if fatigued or on more compliant surface in case of LOB.  Performed bed mobility in ADL apt to simulate home environment.  Again, performed supine <> sit with caregiver assisting.  She was able to perform sit >supine at supervision level, however continues to require min assist to elevate trunk into sitting when going supine >sit.  Provided mod cues for pt to use UEs as much as possible, rather than reaching  for caregiver/therapist to assist.  Ambulated to ortho gym in order to perform car transfer at height of personal car at D/C at supervision level with cues for pt, as well as caregiver on proper technique and not using door frame for assist, but rather using seat to adjust hips into/out of car.  She was able to elevate RLE into/out of car on her own.  At end of session, ambulated up/down ramp to simulate home entry, as well as curb surface x 2 with supervision and mod verbal and tactile cues for proper sequencing/technique with curb step.  Attempted to have pt practice performing floor transfer again, however she refused, therefore simulated with this therapist demonstrating technique, but also educating that if pt feels she cannot get up and caregiver can not easily assist, to call 911 to prevent further pt injury and caregiver injury.  Caregiver verbalized and returned all demonstration/understanding of education during session.  Pt left in w/c in room with all needs in reach.    Therapy Documentation Precautions:  Precautions Precautions: Fall Precaution Comments: right hemiparesis, expressive difficulty Restrictions Weight Bearing Restrictions: No   Pain: Continues to have increased pain with ambulation in L knee.  Voltaren gel applied at end of session.    Locomotion : Ambulation Ambulation/Gait Assistance: 5: Supervision   See FIM for current functional status  Therapy/Group: Individual Therapy  Vista Deck 06/15/2013, 10:31 AM

## 2013-06-15 NOTE — Progress Notes (Signed)
Physical Therapy Session Note  Patient Details  Name: Stacie [name] MRN: [id number] Date of Birth: 06-20-1941  Today's Date: 06/15/2013 Time: 8657-8469 Time Calculation (min): 28 min  Short Term Goals: Week 3:  PT Short Term Goal 1 (Week 3): Pt will perform bed mobility at min assist consistently with HOB flat PT Short Term Goal 2 (Week 3): Pt will perform dynamic standing balance with min assist consistently to perform functional activities PT Short Term Goal 3 (Week 3): Pt will ambulate x 100' w/ LRAD with supervision and min cues for technique.  PT Short Term Goal 4 (Week 3): Pt will ascend/descend 3 stairs with B handrails at min assist to simulate home entry  Skilled Therapeutic Interventions/Progress Updates:   Pt received lying in bed with caregiver present during session.  Performed supine <> sit at min assist with HOB flat and without use of handrails to simulate home environment.  Continues to reach for therapist, despite max verbal and tactile cues to increase use of RUE to self assist trunk.  Focus of session was gait training without use of AD to challenge balance and ambulation over increased compliant surfaces to address balance strategies. Ambulated to/from gym (>150') without AD at min/guard assist with continued intermittent cues for increased step length on RLE.  Note that she had increased difficulty with balance without AD, esp when spoken to/externally distracted.  Once at gym, performed ambulation over mat surface with objects underneath to increase difficulty.  She was able to perform x 2 reps with min assist, again with continued cues for increased step length on RLE.  Pt requesting to return to bed, therefore left pt in bed with bed alarm set and all needs in reach.   Therapy Documentation Precautions:  Precautions Precautions: Fall Precaution Comments: right hemiparesis, expressive difficulty Restrictions Weight Bearing Restrictions: No   Pain:  Pt with no c/o pain during session.   See FIM for current functional status  Therapy/Group: Individual Therapy  Vista Deck 06/15/2013, 3:30 PM

## 2013-06-16 ENCOUNTER — Inpatient Hospital Stay (HOSPITAL_COMMUNITY): Payer: Medicaid Other | Admitting: Speech Pathology

## 2013-06-16 ENCOUNTER — Inpatient Hospital Stay (HOSPITAL_COMMUNITY): Payer: Self-pay | Admitting: Rehabilitation

## 2013-06-16 ENCOUNTER — Encounter (HOSPITAL_COMMUNITY): Payer: Self-pay | Admitting: Occupational Therapy

## 2013-06-16 LAB — GLUCOSE, CAPILLARY
Glucose-Capillary: 165 mg/dL — ABNORMAL HIGH (ref 70–99)
Glucose-Capillary: 102 mg/dL — ABNORMAL HIGH (ref 70–99)
Glucose-Capillary: 124 mg/dL — ABNORMAL HIGH (ref 70–99)

## 2013-06-16 MED ORDER — TAMSULOSIN HCL 0.4 MG PO CAPS: 0.4000 mg | ORAL_CAPSULE | Freq: Every day | ORAL | Status: AC

## 2013-06-16 MED ORDER — SENNOSIDES-DOCUSATE SODIUM 8.6-50 MG PO TABS: 2.0000 | ORAL_TABLET | Freq: Every day | ORAL | Status: AC

## 2013-06-16 MED ORDER — METFORMIN HCL 500 MG PO TABS
1000.0000 mg | ORAL_TABLET | Freq: Two times a day (BID) | ORAL | Status: DC
  Administered 2013-06-16 – 2013-06-17 (×3): 1000 mg via ORAL
  Filled 2013-06-16 (×4): qty 2

## 2013-06-16 MED ORDER — GLIMEPIRIDE 4 MG PO TABS: 4.0000 mg | ORAL_TABLET | Freq: Every day | ORAL | Status: AC

## 2013-06-16 MED ORDER — BETHANECHOL CHLORIDE 25 MG PO TABS: 25.0000 mg | ORAL_TABLET | Freq: Three times a day (TID) | ORAL | Status: DC

## 2013-06-16 MED ORDER — TIZANIDINE HCL 2 MG PO TABS: 2.0000 mg | ORAL_TABLET | Freq: Three times a day (TID) | ORAL | Status: AC

## 2013-06-16 MED ORDER — DICLOFENAC SODIUM 1 % TD GEL: 2.0000 g | Freq: Four times a day (QID) | TRANSDERMAL | Status: AC

## 2013-06-16 MED ORDER — ATORVASTATIN CALCIUM 20 MG PO TABS: 20.0000 mg | ORAL_TABLET | Freq: Every day | ORAL | Status: AC

## 2013-06-16 MED ORDER — BETHANECHOL CHLORIDE 25 MG PO TABS: 25.0000 mg | ORAL_TABLET | Freq: Four times a day (QID) | ORAL | Status: AC

## 2013-06-16 MED ORDER — ASPIRIN 325 MG PO TBEC: 325.0000 mg | DELAYED_RELEASE_TABLET | Freq: Every day | ORAL | Status: AC

## 2013-06-16 MED ORDER — POLYSACCHARIDE IRON COMPLEX 150 MG PO CAPS
150.0000 mg | ORAL_CAPSULE | Freq: Every day | ORAL | Status: AC
Start: 2013-06-16 — End: ?

## 2013-06-16 MED ORDER — METFORMIN HCL 500 MG PO TABS: 1000.0000 mg | ORAL_TABLET | Freq: Two times a day (BID) | ORAL | Status: AC

## 2013-06-16 NOTE — Progress Notes (Signed)
Speech Language Pathology Daily Session Note & Discharge Summary   Patient Details  Name: Stacie [name] MRN: [id number] Date of Birth: [birth year]  Today's Date: 06/16/2013 Time: 0915-1000 Time Calculation (min): 45 min  Short Term Goals: Week 3: SLP Short Term Goal 1 (Week 3): Patient will request help as needed with basic wants/needs with Supervision level questioning cues. SLP Short Term Goal 2 (Week 3): Patient will solve functional, complex problem solving with Min verbal cues. SLP Short Term Goal 3 (Week 3): Patient will utilize external aids to recall basic, information with Supervision level verbal/questioning cues.  Skilled Therapeutic Interventions: Skilled treatment session focused on addressing cognitive-linguistic goals. Interpreter present for session; SLP facilitated session with medication management task. Patient required Supervision level verbal cues to locate named times of week accurately on medication box; Min verbal cues to accurately locate information on written external aids and Mod cues to accurately load medications.  Patient asked appropriate questions about her medications and SLP deferred to RN for answers.  Patient's goals are met and education has been completed with caregiver; patient is ready for discharge home.   FIM:  Comprehension Comprehension Mode: Auditory Comprehension: 5-Understands basic 90% of the time/requires cueing < 10% of the time Expression Expression Mode: Verbal Expression: 5-Expresses basic needs/ideas: With extra time/assistive device Social Interaction Social Interaction: 5-Interacts appropriately 90% of the time - Needs monitoring or encouragement for participation or interaction. Problem Solving Problem Solving: 5-Solves basic 90% of the time/requires cueing < 10% of the time Memory Memory: 4-Recognizes or recalls 75 - 89% of the time/requires cueing 10 - 24% of the time  Pain Pain Assessment Pain  Assessment: No/denies pain  Therapy/Group: Individual Therapy   Speech Language Pathology Discharge Summary  Patient Details  Name: Stacie [name] MRN: [id number] Date of Birth: 04-24-1941  Today's Date: 06/16/2013  Patient has met 3 of 4 long term goals.  Patient to discharge at overall Supervision;Min level.  Reasons goals not met: patient continues to require Min cues to utilize external aids that assist with recall   Clinical Impression/Discharge Summary: Patient met 3 out of 4 long term goals during her CIR stay due to gains in diet advancement and toleration with set-up assist, functional basic problem solving, initiation of verbal expression of wants and needs and use of external memory aids.  She requires Supervision-Min assist with basic tasks and mod assist with more complex tasks such as money and medication management.  She continues to demonstrate difficulty with requesting help as needed due to decreased awareness, recall as well as personality.  It is recommended that patient continue to receive skilled SLP services at the next level of care (outpatient preferred) and continue to focus on functional tasks to maximize safety, increase functional independence, and reduce burden of care prior upon discharge home with family.  Care Partner:  Caregiver Able to Provide Assistance: Yes  Type of Caregiver Assistance: Cognitive  Recommendation:  Home Health SLP;24 hour supervision/assistance;Outpatient SLP  Rationale for SLP Follow Up: Maximize functional communication;Maximize cognitive function and independence;Reduce caregiver burden   Equipment:  none  Reasons for discharge: Treatment goals met;Discharged from hospital   Patient/Family Agrees with Progress Made and Goals Achieved: Yes   See FIM for current functional status  Charlane Ferretti., CCC-SLP [phone]  Stacie [name] 06/16/2013, 5:23 PM

## 2013-06-16 NOTE — Discharge Summary (Signed)
Physician Discharge Summary  Patient ID: Stacie [name] MRN: [id number] DOB/AGE: Jul 21, 1940 72 y.o.  Admit date: 05/24/2013 Discharge date: 06/17/13  Discharge Diagnoses:  Principal Problem:   CVA (cerebral infarction) Active Problems:   Type II or unspecified type diabetes mellitus with neurological manifestations, not stated as uncontrolled(250.60)   Dyslipidemia   GERD (gastroesophageal reflux disease)   Iron deficiency anemia, unspecified   UTI (urinary tract infection)--E coli   Pain in joint, right shoulder region   Acute renal failure   Urinary retention with incomplete bladder emptying   Discharged Condition: Stable   Significant Diagnostic Studies: No results found.  Labs:  Basic Metabolic Panel:  Recent Labs Lab 06/14/13 1010  NA 139  K 4.5  CL 101  CO2 26  GLUCOSE 211*  BUN 22  CREATININE 0.80  CALCIUM 9.6    CBC:    Component Value Date/Time   WBC 11.8* 05/25/2013 0658   RBC 4.56 05/25/2013 0658   HGB 10.4* 05/25/2013 0658   HCT 31.9* 05/25/2013 0658   PLT 203 05/25/2013 0658   MCV 70.0* 05/25/2013 0658   MCH 22.8* 05/25/2013 0658   MCHC 32.6 05/25/2013 0658   RDW 14.7 05/25/2013 0658   LYMPHSABS 3.2 05/25/2013 0658   MONOABS 1.5* 05/25/2013 0658   EOSABS 0.5 05/25/2013 0658   BASOSABS 0.0 05/25/2013 0658      CBG:  Recent Labs Lab 06/16/13 1640 06/16/13 2033 06/17/13 0746 06/17/13 1154 06/17/13 1658  GLUCAP 102* 124* 153* 161* 115*    Brief HPI:   Ms. Stacie [name] is a 72 year old non-english speaking [place] female with history of DM, HTN, angina (negative cath this summer); who was found on the floor by family with inability to speak or move her right side. She was taken to [facility] on 05/19/13 and MRI brain done revealing L-ACA infarct affecting medial frontal lobe and mild generalized small vessel disease. Carotid dopplers with <50% R-ICA stenosis. 2D echo with EF 55-60% with moderately elevated  pulmonary artery pressures. Mild TVR.  Neurology (Dr. 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 Patient with resultant dense right hemiparesis, is able to follow basic commands 25-50% accuracy (with interpreter) but mute. On mechanical soft diet. Therapies ongoing and working on pregait activities.   Hospital Course: Stacie [name] was admitted to rehab 05/24/2013 for inpatient therapies to consist of PT, ST and OT at least three hours five days a week. Past admission physiatrist, therapy team and rehab RN have worked together to provide customized collaborative inpatient rehab. She was maintained on ASA for stroke prevention. Follow up labs done past admission showed acute renal failure with BUN at 50 and creatinine at 1.54. She was noted to have problems with urinary retention with bladder volumes at 1450 cc. She was started on bladder program with in and out caths on qid basis and follow up labs show renal failure resolved with treatment of retention.  Vitamin B-12 level were normal at 559. She was started on iron supplement for anemia. Spasticity has improved with addition of Zanaflex.  Diabetes was monitored on qid basis and Amaryl was added with improvement in BS control.    Flomax and urecholine were added with improvement in bladder function.   She has been treated for proteus UTI with keflex. Follow up culture showed ESBL therefore she was treated with two doses of fosfomycin. Family has been educated on double voiding as well as bladder  massage to help with functional voiding due to intermittent high post void residuals. Right shoulder pain due to [facility] pathology was managed with use of tramadol and Voltaren gel. She has made good progress and is at supervision level for all mobility. Family education was done with multiple family members who will assist past discharge.    Rehab course: During patient's stay in rehab weekly team conferences were  held to monitor patient's progress, set goals and discuss barriers to discharge. Patient has had improvement in activity tolerance, balance, postural control, as well as ability to compensate for deficits. She is has had improvement in functional use RUE and RLE as well as improved awareness. Speech therapy has addressed cognitive-linguistic goals. She requires min assist for basic money management tasks. She requires moderate assist for high level cognitive tasks as well as medication management. she requires supervision to min assist for ADL tasks. She is able to transfer and ambulate > 150' with supervision and [facility] in controlled environment.          Disposition: 01-Home or Self Care  Diet: Diabetic diet. Needs to be upright for meals.   Special Instructions: 1.  Check blood sugars twice a day before meals. 2. Drink 6-8 glasses of water daily.  3. Toilet patient every 4 hours while awake. Have patient double void to empty.         Future Appointments Provider Department Dept Phone   07/23/2013 10:30 AM Erick Colace, MD [facility] [phone]       Medication List         aspirin 325 MG EC tablet  Take 1 tablet (325 mg total) by mouth daily.     atorvastatin 20 MG tablet  Commonly known as:  LIPITOR  Take 1 tablet (20 mg total) by mouth daily at 6 PM.     bethanechol 25 MG tablet  Commonly known as:  URECHOLINE  Take 1 tablet (25 mg total) by mouth 4 (four) times daily.     diclofenac sodium 1 % Gel  Commonly known as:  VOLTAREN  Apply 2 g topically 4 (four) times daily. To right shoulder     glimepiride 4 MG tablet  Commonly known as:  AMARYL  Take 1 tablet (4 mg total) by mouth daily with breakfast.     iron polysaccharides 150 MG capsule  Commonly known as:  NIFEREX  Take 1 capsule (150 mg total) by mouth daily.     metFORMIN 500 MG tablet  Commonly known as:  GLUCOPHAGE  Take 2 tablets (1,000 mg total) by mouth 2 (two) times daily  with a meal.     omeprazole 10 MG capsule  Commonly known as:  PRILOSEC  Take 10 mg by mouth daily.     ranolazine 500 MG 12 hr tablet  Commonly known as:  RANEXA  Take 500 mg by mouth 2 (two) times daily.     senna-docusate 8.6-50 MG per tablet  Commonly known as:  Senokot-S  Take 2 tablets by mouth daily. For constipation     tamsulosin 0.4 MG Caps capsule  Commonly known as:  FLOMAX  Take 1 capsule (0.4 mg total) by mouth daily after supper. For bladder     tiZANidine 2 MG tablet  Commonly known as:  ZANAFLEX  Take 1 tablet (2 mg total) by mouth 3 (three) times daily. For muscle spams/tone.     traMADol 50 MG tablet  Commonly known as:  ULTRAM  Take 50 mg  by mouth every 6 (six) hours as needed for moderate pain.       Follow-up Information   Follow up with Hyman Hopes, MD On 07/05/2013. (APPT: 10:20 AM)    Specialty:  Internal Medicine   Contact information:   [address] [place] [address] [phone]       Follow up with Erick Colace, MD On 07/22/2013. (Be there at 10 am   for 10:30 am  appointment)    Specialty:  Physical Medicine and Rehabilitation   Contact information:   [address] [place] [address] [phone]       Signed: Jacquelynn Cree 06/18/2013, 7:56 AM

## 2013-06-16 NOTE — Progress Notes (Signed)
Social Work Patient ID: Stacie [name], female   DOB: 1940-10-30, 72 y.o.   MRN: [id number] Met with pt and interpreter to inform team confernece progression toward goals and discharge set for tomorrow.  She is pleased with her progress And informed no insulin at home along with I & O cath.  DME to be delivered today and family education completed.  Ready for discharge tomorrow.

## 2013-06-16 NOTE — Progress Notes (Signed)
Physical Therapy Discharge Summary  Patient Details  Name: Stacie [name] MRN: [id number] Date of Birth: 16-Dec-1940  Today's Date: 06/16/2013 Time: 8295-6213 and [id number] Time Calculation (min): 41 min and 58 mins  Patient has met 9 of 9 long term goals due to improved activity tolerance, improved balance, improved postural control, increased strength, ability to compensate for deficits, functional use of  right upper extremity and right lower extremity, improved attention, improved awareness and improved coordination.  Patient to discharge at an ambulatory level Supervision.   Patient's care partner is independent to provide the necessary physical and cognitive assistance at discharge.  Reasons goals not met: n/a  Recommendation:  Patient will benefit from ongoing skilled PT services in home health setting to continue to advance safe functional mobility, address ongoing impairments in balance, strength and functional use of RUE/LE, gait abnormality (is close to ambulating without AD), and minimize fall risk.  Equipment: LBQC and ultra hemi w/c  Reasons for discharge: treatment goals met and discharge from hospital  Patient/family agrees with progress made and goals achieved: Yes  PT Treatment/Intervention: AM session:  Pt received sitting in w/c in room with interpreter present to assist with translation during session.  RN also providing meds and Voltaren gel.  Performed ambulation to/from gym today without AD to further challenge balance and increase weight shift to R >150' at min/guard assist.  Again, tend to note that she requires min cues when fatiguing for increased step length on RLE to fully clear foot.  She continues to demonstrate decreased balance strategies and feel that she will continue to benefit from further PT at D/C to work on this to prevent fall risk.  Also note that her gait speed is slightly increased from yesterday.  Performed bed mobility in ADL  apt to better simulate home environment.  She was able to perform sit > supine at supervision level and supine to sit at min assist, however did utilize RUE more today in order to elevate trunk into sitting.  Assessed strength, sensation, coordination, and proprioception.  See details below.  Also performed stair negotiation with  B handrails in step to pattern with continued cues for correct sequencing/technique for safety.  Pt ambulated back to room and left in w/c with interpreter present in room.  All needs in reach.   PM session: Pt received sitting in w/c in room and reluctantly agreeable to therapy this afternoon.  Performed >150' gait with [facility] at supervision level over controlled and carpeted environment in order to simulate home environment.  Continues to require min cues for increased step length on RLE, esp on carpeted surfaces.  Performed plant watering activity in order to work on side stepping (for weight shifting R and L, and hip abd strength) and also to work on gross motor skills and grasp of RUE.  She was able to perform at supervision level, however could only tolerate small amount of water in container due to continued UE weakness.  Ambulated to gym and performed standing kinetron activity with mild resistance x 5 mins in order to increase weight shift and weight bearing through RLE.  Provided mod cues for continuing with activity, despite many rest breaks and also performed with BUE support to single UE support.  Continue to attempt to perform high level balance activities with pt, however she would only agree to picking up single horseshoe from floor and when given task of ambulating with ball toss, she would only agree to perform standing and catching  ball from therapist.  Feel she will be self limiting at home and will hopefully have enough encouragement to continue with therapy.  Pt left in w/c in room with all needs in reach.    PT  Discharge Precautions/Restrictions Precautions Precautions: Fall Precaution Comments: R hemiparesis Restrictions Weight Bearing Restrictions: No   Pain Pain Assessment Pain Assessment: 0-10 Pain Score: 4  Pain Type: Acute pain Pain Location: Knee Pain Orientation: Left Pain Descriptors / Indicators: Aching Pain Intervention(s): Repositioned;Rest;RN made aware Vision/Perception   See OT note Cognition Overall Cognitive Status: Impaired/Different from baseline Arousal/Alertness: Awake/alert Orientation Level: Oriented X4 Attention: Focused;Sustained Focused Attention: Appears intact Sustained Attention: Appears intact Safety/Judgment: Impaired Comments: Pt continues to be somewhat impulsive to get up without ensuring cane near her and also refuses to endorse that she has balance deficits.  Sensation Sensation Light Touch: Appears Intact Proprioception: Appears Intact Coordination Gross Motor Movements are Fluid and Coordinated: No Fine Motor Movements are Fluid and Coordinated: No Coordination and Movement Description: Continues to demonstrate R hemiparesis with decreased coordination with ambulation.  Heel Shin Test: Heel to shin continues to be difficult, She was able to bring R heel to L shin, but unable to move up/down, more so due to body habitus.  Motor  Motor Motor: Hemiplegia Motor - Discharge Observations: Pt continues to have R hemiparesis, and decreased balance strategies  Mobility Bed Mobility Bed Mobility: Rolling Right;Sit to Supine;Right Sidelying to Sit Rolling Right: 5: Supervision Rolling Right Details: Verbal cues for precautions/safety Right Sidelying to Sit: 4: Min guard Right Sidelying to Sit Details: Verbal cues for sequencing;Verbal cues for technique;Tactile cues for initiation;Tactile cues for sequencing Sit to Supine: 5: Supervision Sit to Supine - Details: Verbal cues for precautions/safety Transfers Transfers: Yes Sit to Stand: 5:  Supervision Sit to Stand Details: Verbal cues for precautions/safety Stand to Sit: 5: Supervision Stand to Sit Details (indicate cue type and reason): Verbal cues for precautions/safety Stand Pivot Transfers: 5: Supervision Stand Pivot Transfer Details: Verbal cues for precautions/safety;Verbal cues for technique;Verbal cues for safe use of DME/AE Locomotion  Ambulation Ambulation: Yes Ambulation/Gait Assistance: 5: Supervision Ambulation Distance (Feet): 160 Feet (several reps during session, together >150') Assistive device: Large base quad cane (and w/o cane for balance challenge) Ambulation/Gait Assistance Details: Verbal cues for precautions/safety;Verbal cues for technique;Verbal cues for safe use of DME/AE Gait Gait: Yes Gait Pattern: Impaired Gait Pattern: Decreased stance time - right;Decreased hip/knee flexion - right;Decreased dorsiflexion - right;Decreased weight shift to right;Step-through pattern Stairs / Additional Locomotion Stairs: Yes Stairs Assistance: 4: Min assist Stairs Assistance Details: Verbal cues for sequencing;Verbal cues for technique;Verbal cues for precautions/safety;Verbal cues for gait pattern Stair Management Technique: Two rails;Step to pattern;Forwards Number of Stairs: 5 Height of Stairs: 4 (and 6) Ramp: 5: Supervision (ramp assist for 06/15/13) Curb: 4: Min assist (06/15/13) Wheelchair Mobility Wheelchair Mobility: No Distance: Pt unanble to attempt due to LEs not able to reach floor.  Will hopefully have today in order to educate on proper use.   Trunk/Postural Assessment  Cervical Assessment Cervical Assessment: Within Functional Limits Thoracic Assessment Thoracic Assessment: Within Functional Limits Postural Control Postural Limitations: Pt continues to have posterior pelvic tilt and forward shoulders and head in sitting/standing.   Balance Balance Balance Assessed: Yes Static Sitting Balance Static Sitting - Balance Support: Feet  supported Static Sitting - Level of Assistance: 6: Modified independent (Device/Increase time) Dynamic Sitting Balance Dynamic Sitting - Balance Support: Feet supported Dynamic Sitting - Level of Assistance: 6: Modified independent (Device/Increase  time) Extremity Assessment      RLE Assessment RLE Assessment: Exceptions to [facility] RLE Strength RLE Overall Strength: Deficits RLE Overall Strength Comments: hip flex 2-/5 (more so due to lack of ROM from body habitus), knee flex 3+/5, knee ext 3+/5, ankle DF 3/5, ankle PF 4/5 LLE Assessment LLE Assessment: Within Functional Limits (pain in L knee from arthritis, PTA)  See FIM for current functional status  Vista Deck 06/16/2013, 11:14 AM

## 2013-06-16 NOTE — Patient Care Conference (Signed)
Inpatient RehabilitationTeam Conference and Plan of Care Update Date: 06/16/2013   Time: 11:15 AM    Patient Name: Stacie [name]      Medical Record Number: [id number]  Date of Birth: 12/22/1940 Sex: Female         Room/Bed: 4W07C/4W07C-01 Payor Info: Payor: MEDICAID PENDING / Plan: MEDICAID PENDING / Product Type: *No Product type* /    Admitting Diagnosis: L CVA  Admit Date/Time:  05/24/2013  1:08 PM Admission Comments: No comment available   Primary Diagnosis:  CVA (cerebral infarction) Principal Problem: CVA (cerebral infarction)  Patient Active Problem List   Diagnosis Date Noted  . CVA (cerebral infarction) 05/24/2013  . Type II or unspecified type diabetes mellitus with neurological manifestations, not stated as uncontrolled(250.60) 05/24/2013  . Dyslipidemia 05/24/2013  . GERD (gastroesophageal reflux disease) 05/24/2013  . Iron deficiency anemia, unspecified 05/24/2013    Expected Discharge Date: Expected Discharge Date: 06/17/13  Team Members Present: Physician leading conference: Dr. Claudette Laws Social Worker Present: Dossie Der, LCSW Nurse Present: Chana Bode, RN PT Present: Edson Snowball, PT;Emily Marya Amsler, PT OT Present: Rosalio Loud, Felipa Eth, OT SLP Present: Fae Pippin, SLP PPS Coordinator present : Tora Duck, RN, CRRN     Current Status/Progress Goal Weekly Team Focus  Medical   catheterized qd, CBG improved  diabetic teaching  caregiver training   Bowel/Bladder   occasional incontinence, bladder scans, I & O cath for volumes >350 cc  Continent of bowel and bladder with mod assisr  Timed toileting q 3 hrs   Swallow/Nutrition/ Hydration           goals met  ADL's   Pt is currently supervision for bathing and min assist for dressing.  Min guard assist for toilet and tub transfers as well.    Goals are overall supervision level except for LB dressing.  pt/family education, selfcare retraining, neuromuscular  re-educaton   Mobility   Pt currently min assist for bed mobility, supervision with standing and gait (still with some safety concerns).    supervision overall with min assist for bed mobility.   family education, high level balance   Communication         goals met   Safety/Cognition/ Behavioral Observations  Supervision with basic and Mod with complex  supervision  family education completed   Pain   Pt has no complaints of pain  <3  Pain assessments q shift   Skin   Redness to buttocks, bruising to abdomen from lovenox  no new break down  Monitor and assess skin q shift      *See Care Plan and progress notes for long and short-term goals.  Barriers to Discharge: limited f/u therapy, occ cath    Possible Resolutions to Barriers:  finish tx for UTI    Discharge Planning/Teaching Needs:  Home with family tomorrow who plan to provide 24hour care-family education completed      Team Discussion:  Pt has met her goals and family has been educated and are ready for discharge.  No insulin or I & O cath at home.  UTI almost finished with treatment.    Revisions to Treatment Plan:  Ready for discharge tomorrow   Continued Need for Acute Rehabilitation Level of Care: The patient requires daily medical management by a physician with specialized training in physical medicine and rehabilitation for the following conditions: Daily direction of a multidisciplinary physical rehabilitation program to ensure safe treatment while eliciting the highest outcome that is of  practical value to the patient.: Yes Daily medical management of patient stability for increased activity during participation in an intensive rehabilitation regime.: Yes Daily analysis of laboratory values and/or radiology reports with any subsequent need for medication adjustment of medical intervention for : Neurological problems  Arinze Rivadeneira, Lemar Livings 06/16/2013, 12:57 PM

## 2013-06-16 NOTE — Progress Notes (Signed)
Occupational Therapy Discharge Summary  Patient Details  Name: Stacie [name] MRN: [id number] Date of Birth: 05-08-1941  Today's Date: 06/16/2013 Time: 7829-5621 Time Calculation (min): 43 min  Patient has met 11 of 11 long term goals due to improved balance, postural control, functional use of  RIGHT upper extremity, improved attention and improved coordination.  Patient to discharge at overall Supervision level.  Patient's care partner is independent to provide the necessary physical and cognitive assistance at discharge.    Reasons goals not met: NA all goals met  Recommendation:  Patient will benefit from ongoing skilled OT services in home health setting to continue to advance functional skills in the area of BADL and iADL.  Pt still demonstrates decreased overall dynamic balance as well as weakness and decreased coordination in the RUE.  In addition, she is very strong willed and continues to exhibit decreased anticipatory awareness regarding safety and for these reasons feel she needs initial 24 hour supervision and continued HHOT services.  Feel pt will be able to reach modified independent to even independent level with some aspects of selfcare and transfers with continued therapy.    Equipment: 3:1 and tub bench   Reasons for discharge: treatment goals met and discharge from hospital  Patient/family agrees with progress made and goals achieved: Yes  OT Discharge Precautions/Restrictions  Precautions Precautions: Fall Precaution Comments:      Restrictions Weight Bearing Restrictions: No  Pain Pain Assessment Pain Assessment: No/denies pain ADL  See FIM scale for details  Vision/Perception  Vision - History Baseline Vision: No visual deficits Patient Visual Report: No change from baseline Vision - Assessment Vision Assessment: Vision tested Ocular Range of Motion: Within Functional Limits Alignment/Gaze Preference: Within Defined  Limits Tracking/Visual Pursuits: Able to track stimulus in all quads without difficulty Perception Perception: Within Functional Limits Praxis Praxis: Intact  Cognition Overall Cognitive Status: Impaired/Different from baseline Arousal/Alertness: Awake/alert Attention: Focused;Sustained Focused Attention: Appears intact Sustained Attention: Appears intact Selective Attention: Appears intact Memory: Impaired Memory Impairment: Decreased recall of new information Decreased Short Term Memory: Functional basic Awareness: Impaired Awareness Impairment: Anticipatory impairment Safety/Judgment: Impaired Comments: Pt continues to need mod instructional cueing for hand placement with sit to stand transitions as well as for hemi techniques with dressing and integration of AE for LB selfcare. Sensation Sensation Light Touch: Impaired Detail Light Touch Impaired Details: Impaired RUE Stereognosis: Impaired Detail Stereognosis Impaired Details: Impaired RUE Hot/Cold: Not tested Proprioception: Appears Intact Additional Comments: Pt reports sligth difference in light touch in the right hand compared to the left hand. Coordination Gross Motor Movements are Fluid and Coordinated: No Fine Motor Movements are Fluid and Coordinated: No Coordination and Movement Description: Pt with much improved with RUE functional use compared to evaluation however still with impaired gross and FM coordination.  Slower movement in the RUE with regards to speed.   Motor  Motor Motor: Hemiplegia Motor - Discharge Observations: Pt with very mild hemiparesis in the RUE and RLE.  Currently more timing, sequencing, coordination issue.  Pt is currenlty out of Brunnstrum. Mobility  Transfers Transfers: Sit to Stand Sit to Stand: 5: Supervision Sit to Stand Details: Verbal cues for precautions/safety;Verbal cues for technique Stand to Sit: 5: Supervision Stand to Sit Details (indicate cue type and reason): Verbal  cues for precautions/safety;Verbal cues for technique  Trunk/Postural Assessment  Cervical Assessment Cervical Assessment: Within Functional Limits Thoracic Assessment Thoracic Assessment: Within Functional Limits Postural Control Postural Limitations: Pt continues to have posterior pelvic tilt and  forward shoulders and head in sitting/standing.   Balance Balance Balance Assessed: Yes Static Sitting Balance Static Sitting - Level of Assistance: 6: Modified independent (Device/Increase time) Dynamic Sitting Balance Dynamic Sitting - Balance Support: Feet supported Dynamic Sitting - Level of Assistance: 6: Modified independent (Device/Increase time) Static Standing Balance Static Standing - Balance Support: No upper extremity supported Static Standing - Level of Assistance: 5: Stand by assistance Dynamic Standing Balance Dynamic Standing - Balance Support: Bilateral upper extremity supported Dynamic Standing - Level of Assistance: 5: Stand by assistance Extremity/Trunk Assessment RUE Assessment RUE Assessment: Exceptions to [facility] RUE Strength RUE Overall Strength Comments: Pt currenlty is out of Brunnstrum and demonstrates isolated movement in all joints.  AROM shoulder flexion 0-120 degrees, elbow flexion and extension WFLS with strength 3+/.5,  gross grasp and release WFLs with strength also 3+/5.  She is able to oppose her thumb to all digits at this time.  Decreased FM coordination for fastening buttons as well as decreased overall strength noted with attempted pulling up of pants with the RUE.  Pt is able to perform but needs increased time.   LUE Assessment LUE Assessment: Within Functional Limits  See FIM for current functional status  Darious Rehman OTR/L 06/16/2013, 3:27 PM

## 2013-06-17 DIAGNOSIS — I6330 Cerebral infarction due to thrombosis of unspecified cerebral artery: Secondary | ICD-10-CM

## 2013-06-17 DIAGNOSIS — I10 Essential (primary) hypertension: Secondary | ICD-10-CM

## 2013-06-17 DIAGNOSIS — N390 Urinary tract infection, site not specified: Secondary | ICD-10-CM | POA: Diagnosis not present

## 2013-06-17 DIAGNOSIS — G8110 Spastic hemiplegia affecting unspecified side: Secondary | ICD-10-CM

## 2013-06-17 DIAGNOSIS — E1165 Type 2 diabetes mellitus with hyperglycemia: Secondary | ICD-10-CM

## 2013-06-17 DIAGNOSIS — N179 Acute kidney failure, unspecified: Secondary | ICD-10-CM | POA: Diagnosis present

## 2013-06-17 DIAGNOSIS — M25519 Pain in unspecified shoulder: Secondary | ICD-10-CM | POA: Diagnosis not present

## 2013-06-17 DIAGNOSIS — R339 Retention of urine, unspecified: Secondary | ICD-10-CM | POA: Diagnosis present

## 2013-06-17 LAB — GLUCOSE, CAPILLARY
Glucose-Capillary: 153 mg/dL — ABNORMAL HIGH (ref 70–99)
Glucose-Capillary: 115 mg/dL — ABNORMAL HIGH (ref 70–99)

## 2013-06-17 NOTE — Progress Notes (Signed)
Social Work Discharge Note Discharge Note  The overall goal for the admission was met for:   Discharge location: Yes-HOME WITH FAMILY PROVIDING 24 HR CARE  Length of Stay: Yes-23 DAYS  Discharge activity level: Yes-SUPERVISION/MIN LEVEL  Home/community participation: Yes  Services provided included: MD, RD, PT, OT, SLP, RN, TR, Pharmacy and SW  Financial Services: Other: SELF PAY  Follow-up services arranged: Home Health: [facility]=PT,OT,RN, DME: [facility]-WHEELCHA[facility], LBQC, [facility]SC, [facility] and Patient/Family has no preference for HH/DME agencies  Comments (or additional information):FAMILY TRAINING COMPLETED-CONCERN REGARDING I&O CATH DO NOT FEEL COMFORTABLE WITH-TOLD BY MD WOULD NOT BE NECESSARY  Patient/Family verbalized understanding of follow-up arrangements: Yes  Individual responsible for coordination of the follow-up plan: MELISSA-GRANDDAUGHTER  Confirmed correct DME delivered: Lucy Chris 06/17/2013    Lucy Chris

## 2013-06-17 NOTE — Progress Notes (Signed)
Mrs. Gladstone Lighter discharged to home at 1747.  Discharge information provided by RN to Son-In-Law and daughter.  Educate family about bladder management.  Family states someone will be with the patient 24/7 at home.  Family verbalize understanding with discharge information, no further question ask.

## 2013-06-17 NOTE — Progress Notes (Signed)
Subjective/Complaints: Looking forward to D/C Review of Systems - R shoulder pain improved, denies bowel bladder issues, denies breathing issues, remainder of review negative  Objective: Vital Signs: Blood pressure 120/49, pulse 72, temperature 98.1 F (36.7 C), temperature source Oral, resp. rate 16, height 5\' 3"  (1.6 m), weight 78.654 kg (173 lb 6.4 oz), SpO2 99.00%. No results found. Results for orders placed during the hospital encounter of 05/24/13 (from the past 72 hour(s))  BASIC METABOLIC PANEL     Status: Abnormal   Collection Time    06/14/13 10:10 AM      Result Value Range   Sodium 139  135 - 145 mEq/L   Potassium 4.5  3.5 - 5.1 mEq/L   Chloride 101  96 - 112 mEq/L   CO2 26  19 - 32 mEq/L   Glucose, Bld 211 (*) 70 - 99 mg/dL   BUN 22  6 - 23 mg/dL   Creatinine, Ser 1.61  0.50 - 1.10 mg/dL   Calcium 9.6  8.4 - 09.6 mg/dL   GFR calc non Af Amer 72 (*) >90 mL/min   GFR calc Af Amer 83 (*) >90 mL/min   Comment: (NOTE)     The eGFR has been calculated using the CKD EPI equation.     This calculation has not been validated in all clinical situations.     eGFR's persistently <90 mL/min signify possible Chronic Kidney     Disease.  GLUCOSE, CAPILLARY     Status: Abnormal   Collection Time    06/14/13 11:40 AM      Result Value Range   Glucose-Capillary 142 (*) 70 - 99 mg/dL  GLUCOSE, CAPILLARY     Status: Abnormal   Collection Time    06/14/13  4:43 PM      Result Value Range   Glucose-Capillary 110 (*) 70 - 99 mg/dL  GLUCOSE, CAPILLARY     Status: Abnormal   Collection Time    06/14/13  8:57 PM      Result Value Range   Glucose-Capillary 196 (*) 70 - 99 mg/dL  GLUCOSE, CAPILLARY     Status: Abnormal   Collection Time    06/15/13  7:26 AM      Result Value Range   Glucose-Capillary 157 (*) 70 - 99 mg/dL  GLUCOSE, CAPILLARY     Status: Abnormal   Collection Time    06/15/13 11:23 AM      Result Value Range   Glucose-Capillary 161 (*) 70 - 99 mg/dL  GLUCOSE,  CAPILLARY     Status: None   Collection Time    06/15/13  4:37 PM      Result Value Range   Glucose-Capillary 96  70 - 99 mg/dL  GLUCOSE, CAPILLARY     Status: Abnormal   Collection Time    06/15/13  8:47 PM      Result Value Range   Glucose-Capillary 124 (*) 70 - 99 mg/dL  GLUCOSE, CAPILLARY     Status: Abnormal   Collection Time    06/16/13  7:33 AM      Result Value Range   Glucose-Capillary 165 (*) 70 - 99 mg/dL  GLUCOSE, CAPILLARY     Status: Abnormal   Collection Time    06/16/13 11:25 AM      Result Value Range   Glucose-Capillary 135 (*) 70 - 99 mg/dL  GLUCOSE, CAPILLARY     Status: Abnormal   Collection Time    06/16/13  4:40 PM  Result Value Range   Glucose-Capillary 102 (*) 70 - 99 mg/dL  GLUCOSE, CAPILLARY     Status: Abnormal   Collection Time    06/16/13  8:33 PM      Result Value Range   Glucose-Capillary 124 (*) 70 - 99 mg/dL  GLUCOSE, CAPILLARY     Status: Abnormal   Collection Time    06/17/13  7:46 AM      Result Value Range   Glucose-Capillary 153 (*) 70 - 99 mg/dL     HEENT: normal Cardio: RRR and no murmur Resp: CTA B/L and unlabored GI: BS positive and non distended Extremity:  Pulses positive and No Edema Skin:   Intact Neuro: Alert/Oriented, Cranial Nerve Abnormalities R central 7,, Abnormal Sensory reduced sensory on Right side and Abnormal Motor 4-/5 in RUE, 3 minus/5 right hip flexor knee extensor 2 minus/5 ankle dorsiflexor and plantar flexed 5/5 in the left upper and left lower limb Musc/Skel: Mild/mod pain shoulder ROM and to palpation R AC joint, no swelling or erythema Gen NAD   Assessment/Plan: 1. 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 Physiatrist is providing close team supervision and 24 hour management of active medical problems listed below. Physiatrist and rehab  team continue to assess barriers to discharge/monitor patient progress toward functional and medical goals. Stable for D/C today F/u PCP in 1-2 weeks F/u PM&R 3 weeks See D/C summary See D/C instructions FIM: FIM - Bathing Bathing Steps Patient Completed: Chest;Right Arm;Abdomen;Right upper leg;Left upper leg;Left lower leg (including foot);Left Arm;Right lower leg (including foot);Front perineal area;Buttocks Bathing: 5: Supervision: Safety issues/verbal cues  FIM - Upper Body Dressing/Undressing Upper body dressing/undressing steps patient completed: Thread/unthread left sleeve of pullover shirt/dress;Put head through opening of pull over shirt/dress;Thread/unthread right sleeve of pullover shirt/dresss;Pull shirt over trunk Upper body dressing/undressing: 5: Set-up assist to: Obtain clothing/put away FIM - Lower Body Dressing/Undressing Lower body dressing/undressing steps patient completed: Don/Doff left sock;Don/Doff left shoe;Fasten/unfasten right shoe;Fasten/unfasten left shoe;Don/Doff right sock;Thread/unthread right underwear leg;Thread/unthread left underwear leg;Pull underwear up/down;Thread/unthread right pants leg;Thread/unthread left pants leg;Pull pants up/down;Fasten/unfasten pants Lower body dressing/undressing: 4: Min-Patient completed 75 plus % of tasks  FIM - Toileting Toileting steps completed by patient: Performs perineal hygiene;Adjust clothing prior to toileting Toileting Assistive Devices: Grab bar or rail for support Toileting: 5: Supervision: Safety issues/verbal cues  FIM - Diplomatic Services operational officer Devices: Elevated toilet seat;Grab bars Toilet Transfers: 5-From toilet/BSC: Supervision (verbal cues/safety issues);5-To toilet/BSC: Supervision (verbal cues/safety issues)  FIM - Press photographer Assistive Devices: Arm rests Bed/Chair Transfer: 4: Supine > Sit: Min A (steadying Pt. > 75%/lift 1 leg);5: Sit > Supine:  Supervision (verbal cues/safety issues);5: Bed > Chair or W/C: Supervision (verbal cues/safety issues);5: Chair or W/C > Bed: Supervision (verbal cues/safety issues)  FIM - Locomotion: Wheelchair Distance: Pt unanble to attempt due to LEs not able to reach floor.  Will hopefully have today in order to educate on proper use.  Locomotion: Wheelchair: 0: Activity did not occur FIM - Locomotion: Ambulation Locomotion: Ambulation Assistive Devices: Occupational hygienist Ambulation/Gait Assistance: 5: Supervision Locomotion: Ambulation: 5: Travels 150 ft or more with supervision/safety issues  Comprehension Comprehension Mode: Auditory Comprehension: 5-Understands complex 90% of the time/Cues < 10% of the time  Expression Expression Mode: Verbal Expression: 5-Expresses basic needs/ideas: With extra time/assistive device  Social Interaction Social Interaction Mode: Asleep Social  Interaction: 5-Interacts appropriately 90% of the time - Needs monitoring or encouragement for participation or interaction.  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 DVT Prophylaxis/Anticoagulation: Pharmaceutical: Lovenox  2. Pain Management: Will continue prn tramadol shoulder pain, voltren gel for AC joint  3. Mood: difficulty to ascertain with language as well as language barrier. Family to assist with input. LCSW to follow for evaluation. Affect is bright,  4. Neuropsych: This patient is  capable of making decisions on her own behalf.  5. HTN: Will monitor with bid checks. 6. DM type 2: amaryl and SSI Monitor BS with ac/hs checks and use SSI for elevated BS. Titrate as indicated for BS control. improving control increase  metformin 850 mg twice a day, monitor 7. H/o angina: Will continue Ranexa. Negative cath reported this summer 8. Iron deficiency anemia: will add iron supplement. Check Vit B12 levels.  9.  Urinary retention and e coli UTI,ESBL, switched to fosfomycin per pharmacy. Last dose today prior to D/C LOS (Days) 24 A FACE TO FACE EVALUATION WAS PERFORMED  Aidian Salomon E 06/17/2013, 8:40 AM

## 2013-06-17 NOTE — Progress Notes (Signed)
Recreational Therapy Discharge Summary Patient Details  Name: Stacie [name] MRN: [id number] Pt  Date of Birth: July 13, 1940 Today's Date: 06/17/2013  Long term goals set: 1  Long term goals met: 1  Comments on progress toward goals: Pt with limited participation in TR tasks during LOS.  Pt is close supervision-min assist for TR tasks including simulated community pursuits.  Pt is discharging home with family to provide 24 hour supervision. Reasons for discharge: discharge from hospital Patient/family agrees with progress made and goals achieved: Yes  [name] 06/17/2013, 8:31 AM

## 2013-06-29 ENCOUNTER — Telehealth: Payer: Self-pay

## 2013-06-29 NOTE — Telephone Encounter (Signed)
[facility] called to let us know that they are just getting out to see the patient due to unable to contact patient.

## 2013-07-23 ENCOUNTER — Encounter: Payer: Self-pay | Admitting: Physical Medicine & Rehabilitation

## 2013-07-23 ENCOUNTER — Encounter: Payer: Medicaid Other | Attending: Physical Medicine & Rehabilitation

## 2013-07-23 ENCOUNTER — Inpatient Hospital Stay: Payer: Self-pay | Admitting: Physical Medicine & Rehabilitation

## 2013-07-23 ENCOUNTER — Ambulatory Visit (HOSPITAL_BASED_OUTPATIENT_CLINIC_OR_DEPARTMENT_OTHER): Payer: Medicaid Other | Admitting: Physical Medicine & Rehabilitation

## 2013-07-23 VITALS — BP 155/74 | HR 99 | Resp 14 | Ht <= 58 in | Wt 174.0 lb

## 2013-07-23 DIAGNOSIS — I69998 Other sequelae following unspecified cerebrovascular disease: Secondary | ICD-10-CM | POA: Insufficient documentation

## 2013-07-23 DIAGNOSIS — K219 Gastro-esophageal reflux disease without esophagitis: Secondary | ICD-10-CM | POA: Insufficient documentation

## 2013-07-23 DIAGNOSIS — I639 Cerebral infarction, unspecified: Secondary | ICD-10-CM

## 2013-07-23 DIAGNOSIS — G8110 Spastic hemiplegia affecting unspecified side: Secondary | ICD-10-CM

## 2013-07-23 DIAGNOSIS — I10 Essential (primary) hypertension: Secondary | ICD-10-CM | POA: Insufficient documentation

## 2013-07-23 DIAGNOSIS — I6350 Cerebral infarction due to unspecified occlusion or stenosis of unspecified cerebral artery: Secondary | ICD-10-CM

## 2013-07-23 DIAGNOSIS — E119 Type 2 diabetes mellitus without complications: Secondary | ICD-10-CM | POA: Insufficient documentation

## 2013-07-23 NOTE — Patient Instructions (Signed)
Follow up with primary MD May use a single pont cane  No cooking or driving, may make a cold meal  May be left alone for 1-2 hours  Prevencin del ictus (Stroke Prevention) Algunos problemas de salud y ciertas conductas favorecen el ictus. A continuacin se indican algunas formas de disminuir el riesgo de tener un ictus.   Haga alguna actividad fsica al menos durante 30 minutos todos [place] das.  No fume. Trate de no rodearse de Educational psychologistpersonas que fuman.  No beba alcohol en exceso.  No beba ms de Progress Energydos copas al da si es hombre.  No beba ms de una copa al da si es mujer y no est embarazada.  Consuma alimentos saludables, como frutas y verduras. Si le indican una dieta especfica, sgala estrictamente.  Mantenga sus niveles de colesterol bajo control con la dieta y medicamentos. Consuma alimentos bajos en grasas saturadas, grasas trans, colesterol y ricos en fibra.  Si tiene diabetes, siga todos los planes dietticos y tome los medicamentos como se le indique.  Si usted tiene la presin arterial alta (hipertensin), siga todos los planes dietticos y tome los medicamentos segn las indicaciones.  Mantenga un peso saludable. Consuma alimentos bajos en caloras, sal, grasas saturadas, grasas trans y colesterol.  No consuma drogas.  Evite las pldoras anticonceptivas, si corresponde. Hable con su mdico acerca de los riesgos de tomar pldoras anticonceptivas.  Hable con su mdico si usted tiene problemas de sueo (apnea del sueo).  Tome todos los medicamentos segn las indicaciones de su mdico.  Es posible que le indiquen que tome aspirina o anticoagulantes. Tome los medicamentos como le indic el mdico.  Conozca todas las instrucciones de los medicamentos.  Asegrese de tener bajo control cualquier otra enfermedad que tenga. SOLICITE AYUDA DE INMEDIATO SI:  Pierde repentinamente la sensibilidad (siente adormecimiento) o siente debilidad en el rostro, un brazo o una  pierna.  Su cara o prpado se caen hacia un lado.  Se siente sbitamente confundido.  Tiene dificultad para hablar afasia o comprender lo que las M.D.C. Holdingspersonas dicen.  Presenta dificultad para la visin de uno o ambos ojos.  Tiene dificultad repentina para caminar.  Tiene mareos.  Pierde el equilibrio o sus movimientos son torpes (falta de coordinacin).  Siente un dolor de cabeza sbito e intenso y no sabe la causa.  Siente un dolor nuevo en el pecho.  Siente un aleteo en el corazn o le falta un latido (ritmo cardaco irregular). No espere para ver si los sntomas desaparecen. Solicite ayuda de inmediato. Comunquese con el servicio de emergencias de su localidad (911 en los [place]). No conduzca por sus propios medios Dollar Generalhasta el hospital. Document Released: 12/24/2011 Document Revised: 04/14/2013 ExitCare Patient Information 2014 [place] [place].

## 2013-07-23 NOTE — Progress Notes (Signed)
Subjective:    Patient ID: Stacie [name], female    DOB: 1941/04/28, 73 y.o.   MRN: [id number]  HPI Admit date: 05/24/2013  Discharge date: 06/17/13 Ms. Stacie [name] is a 73 year old non-english speaking [place] female with history of DM, HTN, angina (negative cath this summer); who was found on the floor by family with inability to speak or move her right side. She was taken to [facility] on 05/19/13 and MRI brain done revealing L-ACA infarct affecting medial frontal lobe and mild generalized small vessel disease. Carotid dopplers with <50% R-ICA stenosis. 2D echo with EF 55-60% with moderately elevated pulmonary artery pressures. Mild TVR. Neurology (Dr. Cristopher Peru) consulted and recommended ASA for thrombotic stroke due to SVD  no falls No therapy  Ambulates with a quad cane at home. Family provides 24 7 supervision. She is independent with toileting as well as dressing. Still requires some assist for bathing Pain Inventory Average Pain 0 Pain Right Now 0 My pain is cramping  In the last 24 hours, has pain interfered with the following? General activity 0 Relation with others 0 Enjoyment of life 0 What TIME of day is your pain at its worst? night Sleep (in general) Fair  Pain is worse with: when going to bed Pain improves with: rest Relief from Meds: 4  Mobility walk with assistance use a cane how many minutes can you walk? 20 do you drive?  no  Function retired I need assistance with the following:  household duties Do you have any goals in this area?  yes  Neuro/Psych confusion depression  Prior Studies Any changes since last visit?  no  Physicians involved in your care Any changes since last visit?  no   Family History  Problem Relation Age of Onset  . Diabetes Sister   . Hypertension Sister    History   Social History  . Marital Status: Single    Spouse Name: N/A    Number of Children: N/A  . Years of Education: N/A    Social History Main Topics  . Smoking status: None  . Smokeless tobacco: None  . Alcohol Use: None  . Drug Use: None  . Sexual Activity: None   Other Topics Concern  . None   Social History Narrative  . None   Past Surgical History  Procedure Laterality Date  . Tendon Right     heel tendon surgery   Past Medical History  Diagnosis Date  . Diabetes mellitus without complication   . GERD (gastroesophageal reflux disease)   . Right knee pain   . Angina pectoris   . Insomnia   . Frequency of urination    BP 155/74  Pulse 99  Resp 14  Ht 4\' 8"  (1.422 m)  Wt 174 lb (78.926 kg)  BMI 39.03 kg/m2  SpO2 97%     Review of Systems  Psychiatric/Behavioral: Positive for confusion. The patient is nervous/anxious.   All other systems reviewed and are negative.       Objective:   Physical Exam  Motor strength 4/5 in the right deltoid, bicep, tricep, grip, hip flexor, knee extensors, ankle dysfunction plantarflexion 5/5 strength in the left hip flexor knee extensor ankle dorsiflexion plantar flexor Sensory intact to light touch in bilateral upper and lower limbs Ambulates with a quad cane no evidence of toe drag her knee instability.      Assessment & Plan:  #1. Left ACA infarct much improved, independent with  simple ADLs except for bathing. Still requires assist for complex ADLs. I do not 4 see her being able to live independently again. I discussed this with the patient's son. Her mobility has improved to the point where she may switch to single point cane

## 2013-08-16 DIAGNOSIS — I69998 Other sequelae following unspecified cerebrovascular disease: Secondary | ICD-10-CM

## 2013-08-16 DIAGNOSIS — M545 Low back pain, unspecified: Secondary | ICD-10-CM

## 2013-08-16 DIAGNOSIS — E119 Type 2 diabetes mellitus without complications: Secondary | ICD-10-CM

## 2013-08-16 DIAGNOSIS — M6281 Muscle weakness (generalized): Secondary | ICD-10-CM

## 2014-09-26 IMAGING — CT CT HEAD WITHOUT CONTRAST
2 series · 16 of 30 positions shown, 20 images · non-contrast
Comparison: MRI 05/19/2013

CLINICAL DATA: Followup Stroke

EXAM:
CT HEAD WITHOUT CONTRAST
TECHNIQUE: Contiguous axial images were obtained from the base of the skull
through the vertex without intravenous contrast.

[Series 2: soft tissue · axial · 0.40mm/px · z∈[+1078,+1204]mm · 13 of 31 slices shown, 17 images]
[im 3/31  brain]
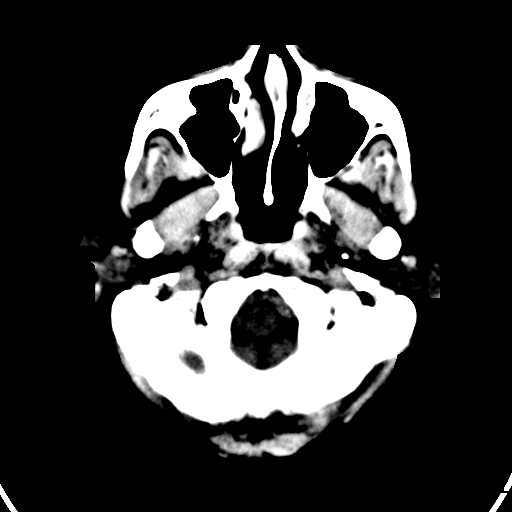
[im 3/31  bone]
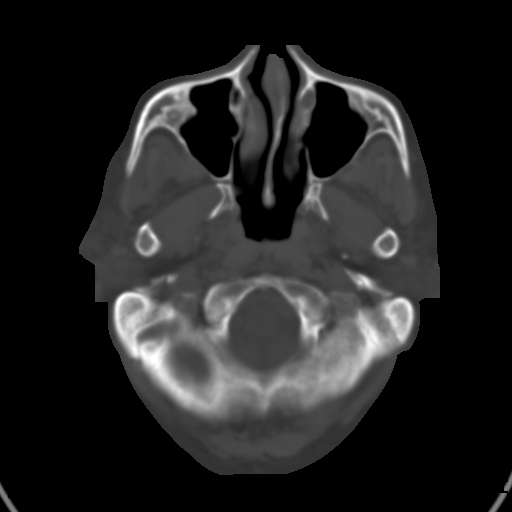
[im 5/31  brain]
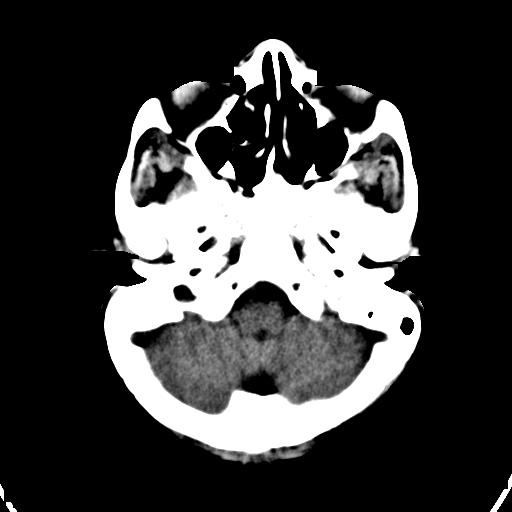
[im 7/31  brain]
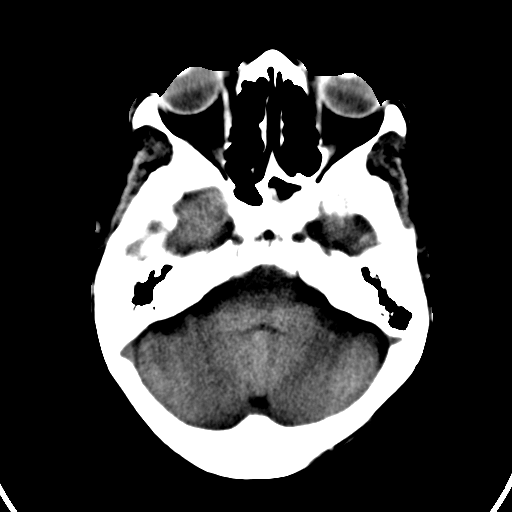
[im 9/31  brain]
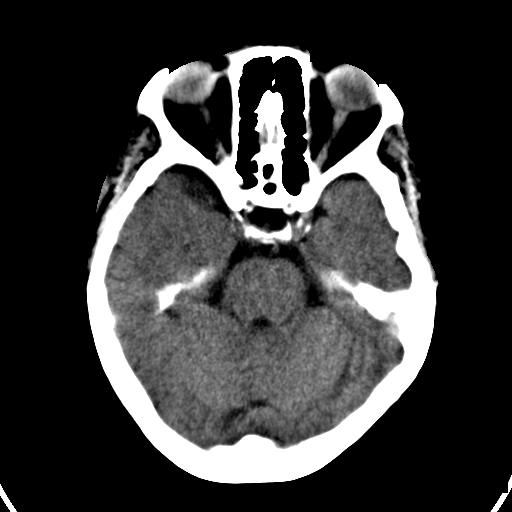
[im 11/31  brain]
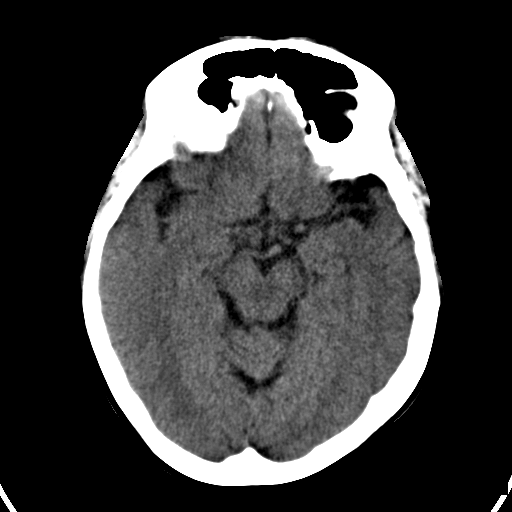
[im 11/31  bone]
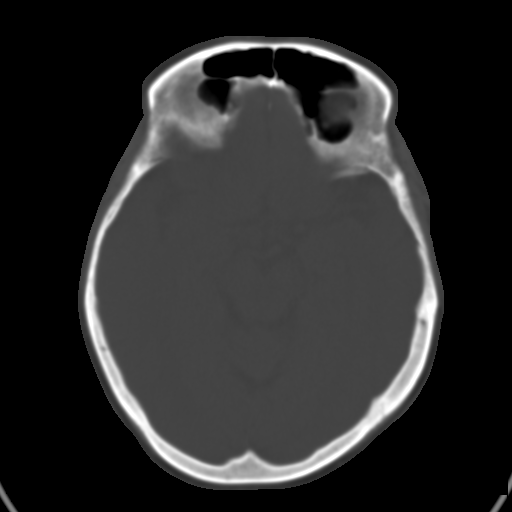
[im 13/31  brain]
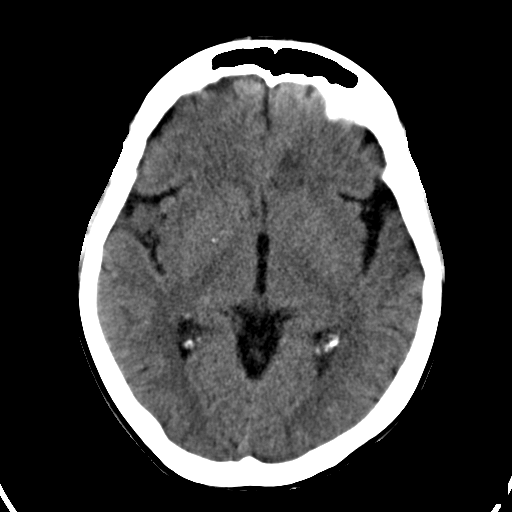
[im 16/31  brain]
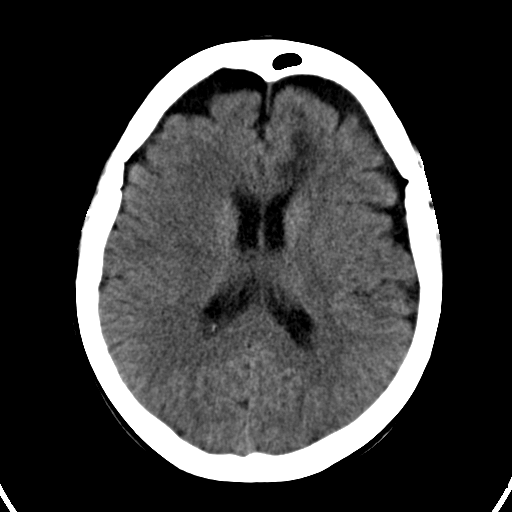
[im 18/31  brain]
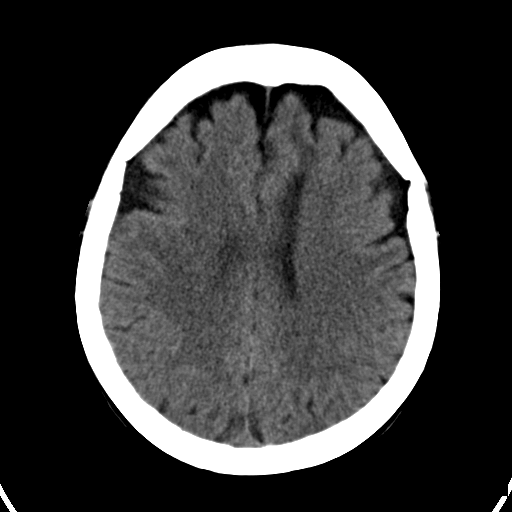
[im 20/31  brain]
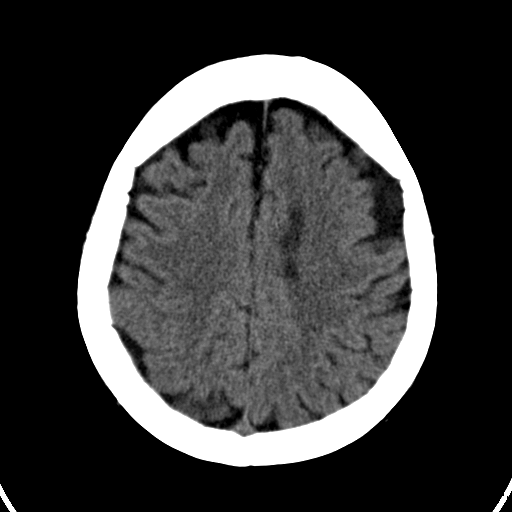
[im 20/31  bone]
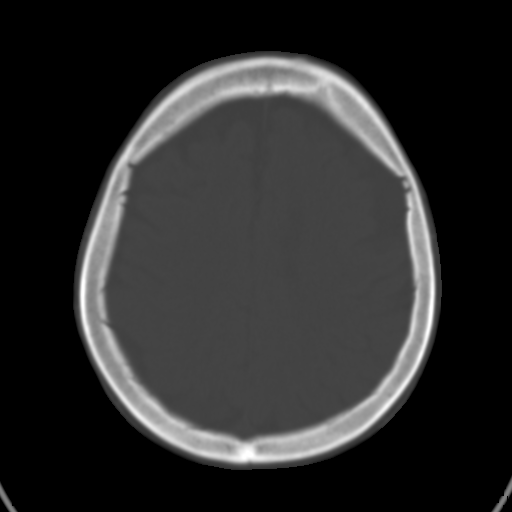
[im 22/31  brain]
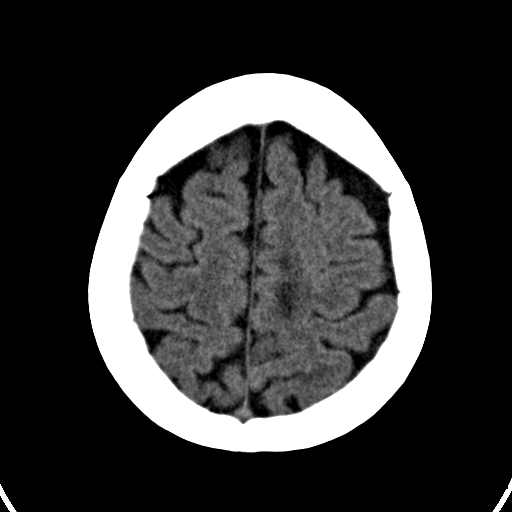
[im 24/31  brain]
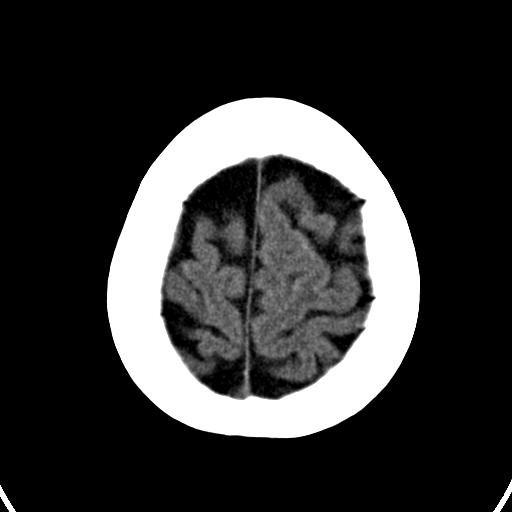
[im 26/31  brain]
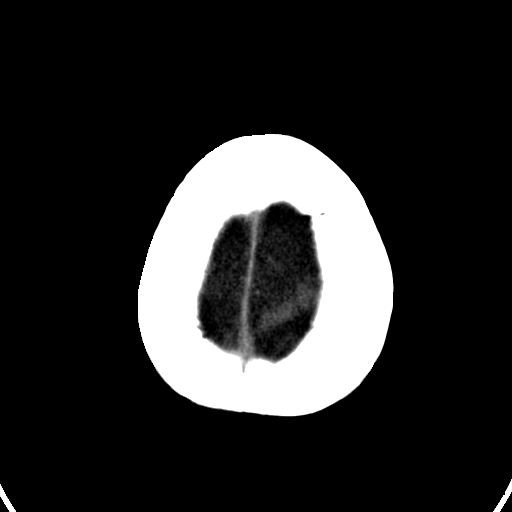
[im 28/31  brain]
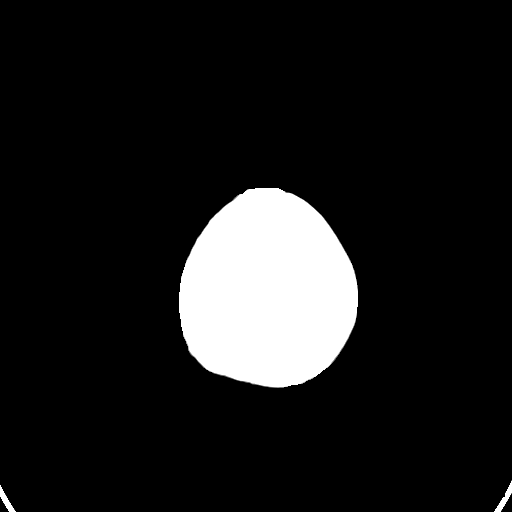
[im 28/31  bone]
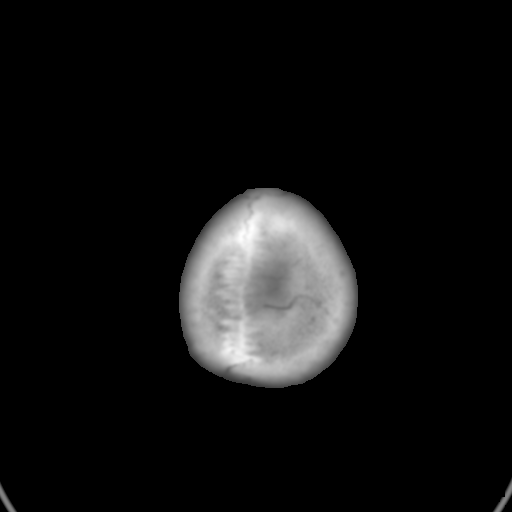

[Series 3: bone · axial · 0.40mm/px · z∈[+1078,+1118]mm · 3 of 30 slices shown]
[im 3/30  bone]
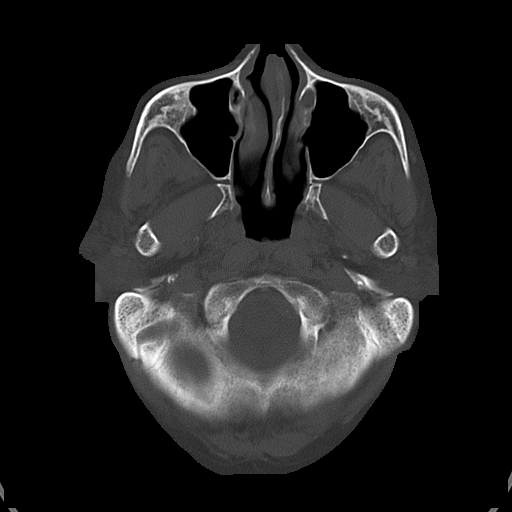
[im 7/30  bone]
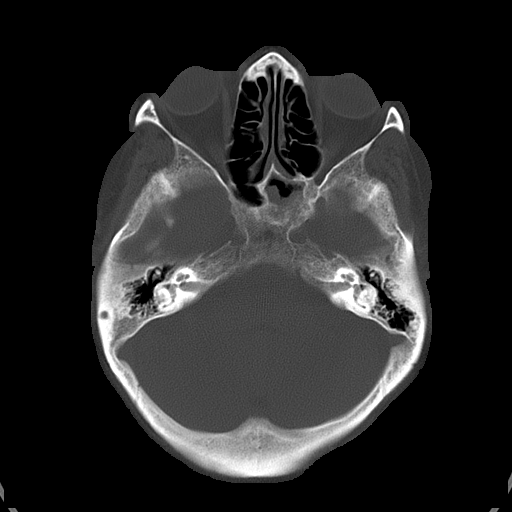
[im 11/30  bone]
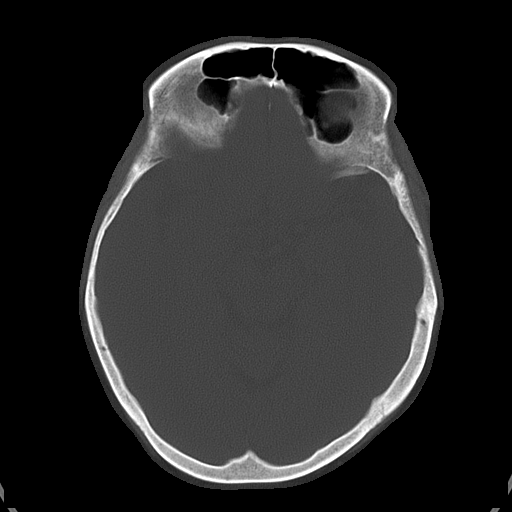

[16 of 30 positions shown; findings below may reference images not displayed]

FINDINGS: Hypodensity in the anterior corpus callosum on the left extending
into the frontal white matter. This is consistent with acute infarct
and showed restricted diffusion on MRI. No associated hemorrhage.

Ventricle size is normal.  No midline shift.  Calvarium intact.
IMPRESSION: Hypodensity left anterior corpus callosum and left frontal lobe
compatible with acute infarct. Negative for hemorrhage.

## 2014-10-28 NOTE — Discharge Summary (Signed)
PATIENT NAME:  Stacie [name], Stacie [name] MR#:  [id number] DATE OF BIRTH:  October 23, 1940  DATE OF ADMISSION:  05/19/2013 DATE OF DISCHARGE:  05/24/2013  PRIMARY CARE PHYSICIAN: Fulton Mole, MD  DISCHARGE DIAGNOSIS: Left anterior cerebral artery/middle cerebral artery watershed infarct with mostly right leg weakness with also right arm weakness with some language deficit. 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   SECONDARY DIAGNOSES: 1.  History of diabetes.  2.  Right knee pains.  3.  Questionable coronary artery disease, on Ranexa.  CONSULTATIONS: 1.  Neurology, Dr. Cristopher Peru. 2.  Physical therapy.  3.  Speech therapy. 4.  Occupational therapy.   PROCEDURES AND RADIOLOGY: Chest x-ray on 11/12 showed cardiomegaly without acute disease.   CT scan of the head without contrast on 11/12 showed no acute pathology.  MRI of the brain on 11/12 showed left ACA territory acute nonhemorrhagic infarct. Chronic microvascular ischemia seen.   Bilateral carotid Dopplers on 11/12 showed minor carotid atherosclerosis. No hemodynamically significant ICA stenosis.   CT scan of the head without contrast on 11/14 showed hypodensity in the left anterior corpus callosum and left frontal lobe compatible with acute infarct. No hemorrhage.   CT scan of the head without contrast on 11/15 showed no significant interval change. No evidence of hemorrhagic transformation, midline shift, or hydrocephalus.   Right lower extremity Doppler on 11/15 showed no DVT.   A 2-D echocardiogram on 11/13 showed normal LV systolic function with EF of 55% to 60%. Impaired relaxation of LV diastolic filling. Decreased LV internal cavity size. Moderately elevated pulmonary artery systolic pressure. Mild tricuspid regurgitation.   UA on admission was negative.   HISTORY AND SHORT HOSPITAL COURSE: The patient  is a 74 year old female with above-mentioned medical problems who was admitted for right-sided weakness with some expressive aphasia, numbness and weakness on the right side. Please see Dr. Winona Legato dictated history and physical for further details. Neurology consultation was obtained with Dr. Cristopher Peru who recommended MRI of the brain which was performed showing acute CVA on the left ACA and MCA distribution. The patient was not a TPA candidate due to delayed presentation. Considering her extensive deficit, physical, occupational, and speech therapy consultation was obtained who recommended acute rehab where she is being transferred today, at a [facility] rehab facility.   PERTINENT PHYSICAL EXAMINATION: VITAL SIGNS: On the date of discharge, her vital signs were as follows: Temperature 99.7, heart rate 75 per minute, respirations 18 per minute, blood pressure 112/57 mmHg, and she is saturating 94% on room air.  CARDIOVASCULAR: S1 and S2 normal. No murmurs, rubs, or gallops.  LUNGS: Clear to auscultation bilaterally. No wheezing, rales, rhonchi, or crepitation.  ABDOMEN: Soft, benign.  NEUROLOGIC: The patient is awake and alert. Her language is fluent but her comprehension is impaired, naming impaired also. She does not follow with eyes to command, but does move spontaneously in all directions. Her muscle strength is 4/5 in the right leg and she has reduced movement of the right arm to command, but she does move it spontaneously. She also has difficulty with coordination on the right and her sensory exam is unreliable. Gait was not checked as it was not safe due to significant weakness on the right.   All other physical examination remained at baseline.   DISCHARGE MEDICATIONS: 1.  Omeprazole 20 mg p.o. daily.  2.  Ranexa 500 mg p.o.  b.i.d.  3.  Tramadol 50 mg p.o. every 6 hours as needed.  4.  Metformin 500 mg p.o. b.i.d.  5.  Lipitor 20 mg p.o. daily.  6.  Aspirin 325 mg p.o. daily.    DISCHARGE DIET: Low sodium, low fat, low cholesterol, 1800 ADA.  DISCHARGE ACTIVITY: As tolerated.   DISCHARGE INSTRUCTIONS: Followup:  The patient was instructed to eat mechanical soft with thin liquids with general aspiration precautions to include sitting fully upright with all oral intake. Tray set up at all meals secondary to right upper extremity weakness. Medication in puree with nursing help as necessary. Monitoring at all meals.   She will need followup with Dr. Fulton MoleHarriett Burns [facility] 2 weeks. [facility] to 6 weeks.   TOTAL TIME DISCHARGING THIS PATIENT: 55 minutes. ____________________________ Ellamae SiaVipul S. Sherryll BurgerShah, MD vss:sb D: 05/24/2013 10:14:46 ET T: 05/24/2013 10:43:52 ET JOB#: [id number]  cc: Lou Irigoyen S. Sherryll BurgerShah, MD, <Dictator> Hyman HopesHarriett P. Burns, MD Hemang K. Sherryll BurgerShah, MD Ellamae SiaVIPUL S [facility] MD ELECTRONICALLY SIGNED 05/27/2013 16:23

## 2014-10-28 NOTE — Consult Note (Signed)
PATIENT NAME:  Stacie [name], LAMKE MR#:  [id number] DATE OF BIRTH:  19-Sep-1940  DATE OF CONSULTATION:  05/20/2013  REFERRING PHYSICIAN:  Katharina Caper, MD CONSULTING PHYSICIAN:  Jeriko Kowalke K. Sherryll Burger, MD  REASON FOR CONSULTATION: Stroke.   HISTORY OF PRESENT ILLNESS: The patient is a 74 year old Hispanic female who had an acute onset of right-sided weakness on 05/19/2013. A detailed history in the Emergency Room found that she was last seen normal the night before. The next morning, she might be not responding very well, but was not recognized as a possible stroke. Later in the day, the patient was found in the floor and then recognized as a new neurological deficit, with right-sided weakness and not talking.   The patient was brought to the ER. There was a consideration for tPA, but once they clarified the time of onset, the patient was decided not to be given tPA.   I had talked to the Emergency Room physician on the phone about this.   The patient does have a past medical history of diabetes mellitus, hyperlipidemia, coronary artery disease. She also had some right lower extremity pain.   PAST SURGICAL HISTORY: Positive for heel tendon operation in the past.   FAMILY HISTORY: Positive for hypertension, diabetes.   SOCIAL HISTORY: She has five children. She lives with her granddaughter. Does not smoke, does not drink alcohol. She is a stay-at-home mom.   REVIEW OF SYSTEMS: Not obtainable due to her aphasia.   PHYSICAL EXAMINATION: VITAL SIGNS: Temperature 98.1, pulse 71, respiratory rate 20, blood pressure 172/96, pulse oximetry 97%.  GENERAL:  She is an ill-looking Hispanic female, lying in bed, not in acute distress. The patient is surrounded by multiple family members.  The patient was alert, but she was mute. She says yes to all the questions.   Per report, she was able to read Spanish words okay. They have not attempted writing because the patient is right-handed and she is  hemiplegic.   The patient's further cognitive exam was not possible of any of her higher cognitive function due to her profound global aphasia.   She might have some comprehension left, as she did follow some verbal commands and she did have some spontaneous smile, but I am not sure whether that was appropriate or not.   She did do some mimicking, such as opening of her mouth or lifting her arm up on the left side.   On her cranial nerves, her pupils are equal, round, and reactive. She did not follow my finger but she did track my face in the room.   It seems like she had full movement but she might have had some visual neglect on the right.   I cannot check her visual fields reliably.   Her facial sensations were not reliably tested. Her hearing was not reliably tested. She does have right facial weakness which involves mostly the lower face.   Tongue was midline, but it is hard to know because the patient did not follow command really well.   The patient is significantly hemiplegic on the right side, but it seemed like her right upper extremity was more dense than the right lower extremity, evidenced by some movement with painful stimuli on the leg compared to the arm.   The patient does have good spontaneous movement of her left upper and lower extremity, around 5/5.  Her sensation seems to be intact on the left, as she withdrew very briskly on the left side, but  it is hard to know whether her sensations are good or not on her right side. She change her facial expression when I pressed her right arm, even though it did not move at all.   Reflexes were intact on the left. She has absent reflexes on the right except she has upgoing toe on the right.   I cannot check her gait due to her flaccid right hemiparesis.  ASSESSMENT AND PLAN: 1.  Stroke. Acute onset of right hemibody weakness, arm more than leg, as well as right face weakness, possible numbness and aphasia, mostly affecting  expression, but also significant impairment of the comprehension.   The unusual part the patient was able to read written words in Spanish, so I think there is some hope that she might gain comprehension back.   The patient does have some vascular risk factors of diabetes and hyperlipidemia, but per family, her diabetes was well controlled. She was in significant stress due to the worry about other family members back home in [place].   There was no acute incident that noticed.  She is not on any recreational drugs.   There was no head trauma.   The patient was not taking antiplatelet medication before, so I agree with starting her on aspirin 325 mg p.o. daily with a statin.   Her blood pressure is on the higher side should be kept that way.   It seems like patient had worsening of her neurological deficit this morning, compared to yesterday.   She might have had increase in her ischemic penumbra.   Unfortunately, there is not a whole lot we can do about it.   If a person is hypotensive, they can be given IV fluids or hespan, but her blood pressure is on the good side.   The patient should get aggressive PT, OT and speech therapy. The patient and family were talked to about stroke.org, American Aphasia Association, as well as I gave them reference of mirror therapy.   They might need lots of social help, as she does not have any insurance.   2.  Constipation. The patient is on already on senna.   The patient's family members asked questions about puffiness on the right side of her arm and leg which is common after stroke, due to her decreased venous return on that side due to inactivity.   I talked to them about post stroke depression as well.   Feel free to contact me with any further questions. I will follow this patient remotely.    ____________________________ Durene CalHemang K. Sherryll BurgerShah, MD hks:cg D: 05/20/2013 20:09:43 ET T: 05/20/2013 23:57:28 ET JOB#: [id number]  cc: Juanmiguel Defelice K.  Sherryll BurgerShah, MD, <Dictator>  Durene CalHEMANG K [facility] MD ELECTRONICALLY SIGNED 06/02/2013 15:58

## 2014-10-28 NOTE — H&P (Signed)
PATIENT NAME:  Stacie VDA Stacie ShepherdDEVIERA, Stacie [name] MR#:  [id number] DATE OF BIRTH:  05/24/1941  DATE OF ADMISSION:  05/19/2013  PRIMARY CARE PHYSICIAN:  Dr. Lawerance BachBurns.  HISTORY OF PRESENT ILLNESS:  The patient is a 74 year old Spanish female with past medical history significant for diabetes mellitus for the past 14 years, history of  hyperlipidemia, history of coronary artery disease, who presents to the hospital with complaints of right-sided weakness. The patient herself is unfortunately not able to provide much history, but apparently she was seen last okay yesterday. Today in the morning, she would not get up from the bed because she did not feel well and her granddaughter who lives with her left for work. However, at around 9:15 a.[name]. the patient's family was called, and on arrival to the home, apparently the patient was found to be on the floor and not able to get up.  She was not able to move her right upper as well as right lower extremities. She was not responding well. She was able to shake her head; however, not able to speak.  They tried to get her up; however, they noted that her arm was also not moving and she was not able to walk. The patient dragged her right lower extremity. She was brought to the Emergency Room for further evaluation. She was also noted to have difficulty speaking and not able to speak the way she usually does. She was also not able to complain. She was brought to the Emergency Room for further evaluation, where she was suspected to have a left-sided stroke with right-sided weakness and hospitalist services were consulted for admission.   PAST MEDICAL HISTORY: Significant for history of diabetes mellitus for 14 years, hyperlipidemia, history of questionable coronary artery disease, evaluation done in [place].  Right lower extremity pain, right knee pains, which were evaluated by orthopedic surgeon and the patient had x-rays done however, no cause of pain was elicited.     MEDICATIONS:  Metformin 500 mg p.o. twice daily, omeprazole 10 mg p.o. daily, Ranexa 500 mg p.o. twice daily, tramadol 50 mg every 6 hours as needed.   PAST SURGICAL HISTORY:  The patient had right lower extremity heel tendon operation in the past.   ALLERGIES: None.   FAMILY HISTORY: The patient's sister has hypertension.  Two sisters have diabetes. The patient's parents are dead.   SOCIAL HISTORY: The patient has 5 children. She lives with her granddaughter. No smoking or alcohol abuse. She is a stay at home mother.    REVIEW OF SYSTEMS:  Not available as the patient is not responding verbally.   PHYSICAL EXAMINATION: VITAL SIGNS: On arrival to the Emergency Room, temperature is 98.3, pulse was 80, respiratory rate was 18, blood pressure 176/55, saturation was 94% on room air.  During my evaluation, her heart rate is 76, blood pressure 135/50. Saturation was 100% on 2 liters of oxygen through nasal cannula.  GENERAL:  Well-developed, obese Spanish female in no significant distress, lying on the stretcher.  HEENT: Pupils are equal reactive to light. Extraocular movements are intact. No icterus or conjunctivitis.  Has normal hearing.  No pharyngeal erythema. Mucosa is moist.  NECK: No masses. Supple, nontender. Thyroid is not enlarged. No adenopathy. No JVD or carotid bruits. Full range of motion. Thick neck was noted.  LUNGS:  Clear to auscultation in all fields.  No rales, rhonchi, diminished breath sounds or wheezing.  No labored inspiration, increased effort, dullness to percussion or overt respiratory distress.  Additional feels five with inspirations, increased effort, dullness to percussion or overt respiratory distress.  CARDIOVASCULAR: S1, S2 appreciated. Systolic murmur of approximately 3 out of 6 was noted in the precordium. PMI not lateralized. Chest is nontender to palpation.  EXTREMITIES:  1+ pedal pulses. No lower extremity edema, calf tenderness or cyanosis.    ABDOMEN: Soft,  nontender. Bowel sounds are present. No hepatosplenomegaly or masses were noted.  RECTAL: Deferred.  MUSCLE STRENGTH: The patient's strength is 5 out of 5 on the left side. The patient does have 3 out of 5 in the right upper extremity and 1 out of 5 in the right lower extremity. No cyanosis. Not able to assess her for kyphosis. Gait is not tested.  SKIN: Did not reveal any rashes, lesions, erythema, nodularity or induration. It was warm and dry to palpation. LYMPHATIC:  No adenopathy in the cervical region. NEUROLOGICAL: Cranial nerves grossly intact. Sensory is diminished to light touch according to the Emergency Room physician. The patient is mute. She is alert. Not able to assess her orientation. She is cooperative but not able to assess her memory. She is nonverbal.   EKG showed normal sinus rhythm at 81 beats per minute, normal axis, incomplete right bundle branch block, borderline EKG with no acute ST-T changes.    LABORATORY DATA: Glucose 193, BUN 20, otherwise BMP was unremarkable. Liver enzymes were normal. Troponin was 0.02. White blood cell count was 8.5, hemoglobin was 10.8 and platelet count 181 with low MCV of 71. Coagulation panel within normal limits. Urinalysis is normal straw clear urine, negative for glucose, bilirubin or ketones. Specific gravity 1.005, pH was 7.0, negative for blood, protein, nitrites or leukocyte esterase, 1 red blood cell, less than 1 white blood cell. No bacteria, 1 epithelial cell was noted.   RADIOLOGIC STUDIES: Chest, portable single view, 05/19/2013 showed cardiomegaly without acute disease.  CT scan of head without contrast revealed no skull fracture or intracranial hemorrhage. Mild small vessel disease type changes without CT evidence of large acute infarct. Left sphenoid sinus opacification was noted.   ASSESSMENT AND PLAN: 1.  Stroke. Admit the patient to the medical floor. Continue her on aspirin therapy. Get MRI of brain as well as carotid ultrasound,  get lipid panel. Start Lipitor. Get physical therapist and occupational therapist as well as speech therapist evaluation for this patient who probably will need to go to skilled nursing facility rehabilitation.  2.  Hypertension. No blood pressure medications at this time. We will follow blood pressure readings.   3.  Diabetes mellitus.  Get hemoglobin A1c, get lipid panel and get dietary involved as well as lifestyle education. 4.  Hyperlipidemia. Get lipid panel and continue the patient on Lipitor.  5.  Right lower extremity pain of unclear etiology, possibly arthritis. We will continue tramadol at this time.   TIME SPENT: 1 hour.    ____________________________ Katharina Caper, MD rv:dp D: 05/19/2013 12:55:46 ET T: 05/19/2013 13:33:12 ET JOB#: [id number]  cc: Katharina Caper, MD, <Dictator> Dr. Earvin Hansen MD ELECTRONICALLY SIGNED 06/16/2013 14:43

## 2014-10-28 NOTE — Consult Note (Signed)
Brief Consult Note: Diagnosis: stroke.   Patient was seen by consultant.   Consult note dictated.   Comments: acute onset right hemiparesis, aphasia - with left ACA infract. Neg stroke etiology work up so far. Agree with ASA, statin, no BP meds, DM managment. - aggressive PT/OT/ST. - was not a tPA candidate due to delayed presentation. - had swallow eval per report. - Concered about worsening of defecit (? expanding the size of penumbra) - consider IV fluids/Hespan if worsens and has hypotension (limited options as BP is not hypotensive). - gave ref of www.stroke.org, american aphasia association, mirror therapy etc. - will follow remotely.  Electronic Signatures: Jolene ProvostShah, Hemang Kalpeshkumar (MD)  (Signed [id number]-Nov-14 20:01)  Authored: Brief Consult Note   Last Updated: 13-Nov-14 20:01 by Jolene ProvostShah, Hemang Kalpeshkumar (MD)
